# Patient Record
Sex: Female | Born: 2000 | Race: Black or African American | Hispanic: No | Marital: Single | Smoking: Never smoker
Health system: Southern US, Community
[De-identification: ages and names within clinical notes are randomized; demographics above are authoritative.]

## PROBLEM LIST (undated history)

## (undated) ENCOUNTER — Emergency Department (HOSPITAL_BASED_OUTPATIENT_CLINIC_OR_DEPARTMENT_OTHER): Discharge status: Absent without leave | Payer: Medicaid Other

## (undated) DIAGNOSIS — R0981 Nasal congestion: Secondary | ICD-10-CM

## (undated) HISTORY — DX: Nasal congestion: R09.81

## (undated) HISTORY — PX: ADENOIDECTOMY: SUR15

## (undated) HISTORY — PX: NASAL SEPTUM SURGERY: SHX37

---
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
## 2019-01-26 ENCOUNTER — Telehealth (HOSPITAL_BASED_OUTPATIENT_CLINIC_OR_DEPARTMENT_OTHER): Payer: Self-pay | Admitting: Emergency Medicine

## 2019-03-30 ENCOUNTER — Emergency Department (HOSPITAL_BASED_OUTPATIENT_CLINIC_OR_DEPARTMENT_OTHER): Payer: Medicaid Other

## 2019-03-30 ENCOUNTER — Other Ambulatory Visit: Payer: Self-pay

## 2019-03-30 ENCOUNTER — Encounter (HOSPITAL_BASED_OUTPATIENT_CLINIC_OR_DEPARTMENT_OTHER): Payer: Self-pay | Admitting: Emergency Medicine

## 2019-03-30 ENCOUNTER — Emergency Department (HOSPITAL_BASED_OUTPATIENT_CLINIC_OR_DEPARTMENT_OTHER)
Admission: EM | Admit: 2019-03-30 | Discharge: 2019-03-30 | Disposition: A | Discharge status: Home / Self Care | Payer: Medicaid Other | Attending: Emergency Medicine | Admitting: Emergency Medicine

## 2019-03-30 DIAGNOSIS — R079 Chest pain, unspecified: Secondary | ICD-10-CM

## 2019-03-30 DIAGNOSIS — Z791 Long term (current) use of non-steroidal anti-inflammatories (NSAID): Secondary | ICD-10-CM | POA: Diagnosis not present

## 2019-03-30 DIAGNOSIS — R0789 Other chest pain: Secondary | ICD-10-CM | POA: Insufficient documentation

## 2019-03-30 DIAGNOSIS — Z79899 Other long term (current) drug therapy: Secondary | ICD-10-CM | POA: Insufficient documentation

## 2019-03-30 DIAGNOSIS — K219 Gastro-esophageal reflux disease without esophagitis: Secondary | ICD-10-CM | POA: Diagnosis not present

## 2019-03-30 LAB — CBC WITH DIFFERENTIAL/PLATELET
Abs Immature Granulocytes: 0.02 10*3/uL (ref 0.00–0.07)
Basophils Absolute: 0 10*3/uL (ref 0.0–0.1)
Basophils Relative: 0 %
Eosinophils Absolute: 0.1 10*3/uL (ref 0.0–0.5)
Eosinophils Relative: 1 %
HCT: 41.2 % (ref 36.0–46.0)
Hemoglobin: 13.6 g/dL (ref 12.0–15.0)
Immature Granulocytes: 0 %
Lymphocytes Relative: 19 %
Lymphs Abs: 1.6 10*3/uL (ref 0.7–4.0)
MCH: 31.8 pg (ref 26.0–34.0)
MCHC: 33 g/dL (ref 30.0–36.0)
MCV: 96.3 fL (ref 80.0–100.0)
Monocytes Absolute: 0.5 10*3/uL (ref 0.1–1.0)
Monocytes Relative: 6 %
Neutro Abs: 6 10*3/uL (ref 1.7–7.7)
Neutrophils Relative %: 74 %
Platelets: 235 10*3/uL (ref 150–400)
RBC: 4.28 MIL/uL (ref 3.87–5.11)
RDW: 14.4 % (ref 11.5–15.5)
WBC: 8.2 10*3/uL (ref 4.0–10.5)
nRBC: 0 % (ref 0.0–0.2)

## 2019-03-30 LAB — BASIC METABOLIC PANEL
Anion gap: 12 (ref 5–15)
BUN: 17 mg/dL (ref 6–20)
CO2: 22 mmol/L (ref 22–32)
Calcium: 9.4 mg/dL (ref 8.9–10.3)
Chloride: 103 mmol/L (ref 98–111)
Creatinine, Ser: 0.82 mg/dL (ref 0.44–1.00)
GFR calc Af Amer: >60 mL/min (ref >60)
GFR calc non Af Amer: >60 mL/min (ref >60)
Glucose, Bld: 68 mg/dL — ABNORMAL LOW (ref 70–99)
Potassium: 3.8 mmol/L (ref 3.5–5.1)
Sodium: 137 mmol/L (ref 135–145)

## 2019-03-30 LAB — TROPONIN I (HIGH SENSITIVITY): Troponin I (High Sensitivity): <2 ng/L (ref <18)

## 2019-03-30 LAB — D-DIMER, QUANTITATIVE: D-Dimer, Quant: 0.3 ug/mL-FEU (ref 0.00–0.50)

## 2019-03-30 MED ORDER — ALUM & MAG HYDROXIDE-SIMETH 200-200-20 MG/5ML PO SUSP
30.0000 mL | Freq: Once | ORAL | Status: AC
  Administered 2019-03-30: 30 mL via ORAL
  Filled 2019-03-30: qty 30

## 2019-03-30 MED ORDER — LIDOCAINE VISCOUS HCL 2 % MT SOLN
15.0000 mL | Freq: Once | OROMUCOSAL | Status: AC
  Administered 2019-03-30: 15 mL via ORAL
  Filled 2019-03-30: qty 15

## 2019-03-30 NOTE — ED Triage Notes (Signed)
Pt c/o acute onset generalized chest pain that pt reports started at 0100 today. Pt denies cough, fever or radiation of pain. Pt denies N/V

## 2019-03-30 NOTE — Discharge Instructions (Signed)
Your labwork, chest xray, and EKG were very reassuring today. Your symptoms sound consistent with acid reflux given they started 30 minutes after you ate and immediately laid down flat.   After you eat it is important to sit up for 2-3 hours to prevent acid reflux. Avoid spicy or acidic foods as well. Attached are additional resources on the topic.   If this occurs again you can take OTC TUMS to help with your symptoms.   Please follow up with your PCP regarding your ED Visit today.

## 2019-03-30 NOTE — ED Provider Notes (Signed)
[facility] EMERGENCY DEPARTMENT Provider Note   CSN: [id number] Arrival date & time: 03/30/19  1011     History Chief Complaint  Patient presents with  . Chest Pain    Amy Kemp is a 19 y.o. female who presents to the ED today complaining of sudden onset, constant, sharp, diffuse chest pain that began at 1 AM this morning.  Patient reports that she ate a [employer] salad last night with fried chicken on it around midnight.  She states that she went to sleep approximately 30 minutes later and then woke up out of her sleep approximately 30 minutes after that (1 hour after eating) with chest pain.  Patient states that she has shortness of breath with it.  She has not noticed any exacerbating or alleviating factors.  She states that she has not taken anything for the pain.  Has never had pain like this before.  No family history of MIs.  No recent prolonged travel or immobilization.  No hemoptysis.  No exogenous hormone use.  No active malignancy.  No family history of clotting disorders.  Patient denies fever, chills, nausea, vomiting, abdominal pain, any other associated symptoms.   The history is provided by the patient.       Past Medical History:  Diagnosis Date  . Nasal congestion     Patient Active Problem List   Diagnosis Date Noted  . Perennial allergic rhinitis 03/26/2017  . Allergic conjunctivitis 03/26/2017  . 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 Fracture of radius 12/31/2010    Past Surgical History:  Procedure Laterality Date  . ADENOIDECTOMY    . ADENOIDECTOMY       OB History   No obstetric history on file.     Family History  Problem Relation Age of Onset  . Allergic rhinitis Sister   . Allergic rhinitis Brother   . Angioedema Neg Hx   . Asthma Neg Hx   . Eczema Neg Hx   . Immunodeficiency Neg Hx   . Urticaria Neg Hx     Social History   Tobacco Use  . Smoking status: Never Smoker  . Smokeless tobacco: Never Used  Substance Use Topics    . Alcohol use: No  . Drug use: No    Home Medications Prior to Admission medications   Medication Sig Start Date End Date Taking? Authorizing Provider  azelastine (ASTELIN) 0.1 % nasal spray Place 1-2 sprays into both nostrils 2 (two) times daily as needed for rhinitis. 03/26/17   Bobbitt, Heywood Iles, MD  butalbital-acetaminophen-caffeine (FIORICET, ESGIC) [phone] MG tablet Take 1 tablet by mouth every 6 (six) hours as needed for headache. 05/31/18 05/31/19  Lawyer, Cristal Deer, PA-C  Carbinoxamine Maleate 4 MG TABS Take 1 tablet (4 mg total) by mouth every 8 (eight) hours as needed. 03/26/17   Bobbitt, Heywood Iles, MD  cetirizine (ZYRTEC) 10 MG tablet TK 1 T PO QD 03/03/17   [provider]  ibuprofen (ADVIL,MOTRIN) 800 MG tablet Take 1 tablet (800 mg total) by mouth every 8 (eight) hours as needed. 05/30/18   Lawyer, Cristal Deer, PA-C  loratadine (CLARITIN) 10 MG tablet Take 10 mg by mouth daily as needed for allergies or rhinitis.    [provider]  montelukast (SINGULAIR) 10 MG tablet Take 1 tablet (10 mg total) by mouth at bedtime. 03/26/17   Bobbitt, Heywood Iles, MD  naproxen (NAPROSYN) 375 MG tablet Take 1 tablet (375 mg total) by mouth 2 (two) times daily. 06/21/18   Arthor Captain, PA-C  Olopatadine HCl (PAZEO) 0.7 % SOLN Place 1 drop into both eyes daily as needed. 03/26/17   Bobbitt, Heywood Iles, MD  promethazine (PHENERGAN) 25 MG tablet Take 1 tablet (25 mg total) by mouth every 6 (six) hours as needed for nausea or vomiting. 03/12/18   Lawyer, Cristal Deer, PA-C  traMADol (ULTRAM) 50 MG tablet Take 1 tablet (50 mg total) by mouth every 6 (six) hours as needed for severe pain. 05/30/18   Lawyer, Cristal Deer, PA-C    Allergies    Patient has no known allergies.  Review of Systems   Review of Systems  Constitutional: Negative for chills and fever.  Respiratory: Positive for shortness of breath. Negative for cough.   Cardiovascular: Positive for chest pain. Negative for  palpitations and leg swelling.  Gastrointestinal: Negative for abdominal pain, nausea and vomiting.  All other systems reviewed and are negative.   Physical Exam Updated Vital Signs BP 116/69 (BP Location: Left Arm)   Pulse (!) 55   Temp 98.8 F (37.1 C) (Oral)   Resp 18   Ht 5\' 3"  (1.6 m)   Wt 63 kg   LMP 03/23/2019 (Approximate)   SpO2 97%   BMI 24.62 kg/m   Physical Exam Vitals and nursing note reviewed.  Constitutional:      Appearance: She is not ill-appearing or diaphoretic.  HENT:     Head: Normocephalic and atraumatic.  Eyes:     Conjunctiva/sclera: Conjunctivae normal.  Cardiovascular:     Rate and Rhythm: Normal rate and regular rhythm.     Pulses:          Radial pulses are 2+ on the right side and 2+ on the left side.       Dorsalis pedis pulses are 2+ on the right side and 2+ on the left side.  Pulmonary:     Effort: Pulmonary effort is normal.     Breath sounds: Normal breath sounds. No decreased breath sounds, wheezing, rhonchi or rales.  Chest:     Chest wall: No tenderness.  Abdominal:     Palpations: Abdomen is soft.     Tenderness: There is no abdominal tenderness.  Musculoskeletal:     Cervical back: Neck supple.     Right lower leg: No tenderness. No edema.     Left lower leg: No tenderness. No edema.  Skin:    General: Skin is warm and dry.  Neurological:     Mental Status: She is alert.     ED Results / Procedures / Treatments   Labs (all labs ordered are listed, but only abnormal results are displayed) Labs Reviewed  BASIC METABOLIC PANEL - Abnormal; Notable for the following components:      Result Value   Glucose, Bld 68 (*)    All other components within normal limits  CBC WITH DIFFERENTIAL/PLATELET  D-DIMER, QUANTITATIVE (NOT AT [facility])  TROPONIN I (HIGH SENSITIVITY)    EKG EKG Interpretation  Date/Time:  Wednesday March 30 2019 10:54:34 EST Ventricular Rate:  55 PR Interval:  136 QRS Duration: 80 QT  Interval:  460 QTC Calculation: 440 R Axis:   82 Text Interpretation: Sinus bradycardia ST elevation, consider early repolarization, pericarditis, or injury Abnormal ECG No significant change since last tracing Confirmed by 04-01-1969 [phone]) on 03/30/2019 12:53:13 PM   Radiology DG Chest 2 View  Result Date: 03/30/2019 CLINICAL DATA:  Chest pain EXAM: CHEST - 2 VIEW COMPARISON:  December 22, 2017 FINDINGS: Lungs are clear. Heart size and pulmonary  vascularity are normal. No adenopathy. No pneumothorax. No bone lesions. IMPRESSION: No abnormality noted. Electronically Signed   By: Lowella Grip III M.D.   On: 03/30/2019 13:04    Procedures Procedures (including critical care time)  Medications Ordered in ED Medications  alum & mag hydroxide-simeth (MAALOX/MYLANTA) 200-200-20 MG/5ML suspension 30 mL (30 mLs Oral Given 03/30/19 1322)    And  lidocaine (XYLOCAINE) 2 % viscous mouth solution 15 mL (15 mLs Oral Given 03/30/19 1322)    ED Course  I have reviewed the triage vital signs and the nursing notes.  Pertinent labs & imaging results that were available during my care of the patient were reviewed by me and considered in my medical decision making (see chart for details).  19 year old female who presents the ED today complaining of diffuse chest pain that began 1 hour after eating a [employer] salad with fried chicken.  She states she went to sleep immediately after eating this.  Reports a burning sensation in her chest.  She does state she feels short of breath as well.  Daughters are stable on arrival to the ED.  Patient is afebrile, nontachycardic, nontachypneic.  She is satting appropriately on room air.  She has no risk factors for PE.  No significant family history for ACS.  She is young and otherwise healthy I doubt ACS today.  Do not feel she needs a troponin at this time.  Will obtain chest x-ray given complaint of shortness of breath to evaluate for any pulmonary manifestations causing  her chest pain.  Will give GI cocktail and obtain screening labs at this time.  EKG unchanged from previous; no signs of ischemia. Discussed case with attending physician Dr. Darl Householder who agrees with plan.   CXR unremarkable. No abnormalities on CBC and BMP. Upon reevaluation after GI cocktail pt states her pain is still an 8/10. She is sitting upright in bed and appears to be in no acute distress. I have very little concern for PE but given continuation of pain I will order a D dimer and troponin at this time.   D dimer negative. Troponin < 2. After reevaluation pt states she is ready to go home as her symptoms have resolved. Given this is a one time recurrence and pt is young do not feel she needs to be started on a PPI at this time. I had lengthy discussion with pt in regards to not laying down for 2-3 hours after eating, avoiding spicy/greasy foods, and to take OTC calcium carbonate as needed if symptoms reoccur. Pt is advised to follow up with her PCP. Strict return precautions discussed. Pt in agreement with plan and stable for discharge home.   Clinical Course as of Mar 29 1717  Wed Mar 30, 2019  1512 D-Dimer, Quant: 0.30 [MV]  1654 Troponin I (High Sensitivity): <2 [MV]    Clinical Course User Index [MV] Eustaquio Maize, PA-C   This note was prepared using Dragon voice recognition software and may include unintentional dictation errors due to the inherent limitations of voice recognition software.  MDM Rules/Calculators/A&P                       Final Clinical Impression(s) / ED Diagnoses Final diagnoses:  Nonspecific chest pain  Gastroesophageal reflux disease without esophagitis    Rx / DC Orders ED Discharge Orders    None       Discharge Instructions     Your labwork, chest xray, and EKG were  very reassuring today. Your symptoms sound consistent with acid reflux given they started 30 minutes after you ate and immediately laid down flat.   After you eat it is important to  sit up for 2-3 hours to prevent acid reflux. Avoid spicy or acidic foods as well. Attached are additional resources on the topic.   If this occurs again you can take OTC TUMS to help with your symptoms.   Please follow up with your PCP regarding your ED Visit today.        Tanda Rockers, PA-C 03/30/19 1719    Charlynne Pander, MD 03/31/19 [phone]

## 2019-04-20 ENCOUNTER — Encounter (HOSPITAL_BASED_OUTPATIENT_CLINIC_OR_DEPARTMENT_OTHER): Payer: Self-pay | Admitting: Emergency Medicine

## 2019-04-20 ENCOUNTER — Other Ambulatory Visit: Payer: Self-pay

## 2019-04-20 ENCOUNTER — Emergency Department (HOSPITAL_BASED_OUTPATIENT_CLINIC_OR_DEPARTMENT_OTHER)
Admission: EM | Admit: 2019-04-20 | Discharge: 2019-04-20 | Disposition: A | Discharge status: Home / Self Care | Payer: Medicaid Other | Attending: Emergency Medicine | Admitting: Emergency Medicine

## 2019-04-20 ENCOUNTER — Emergency Department (HOSPITAL_BASED_OUTPATIENT_CLINIC_OR_DEPARTMENT_OTHER): Payer: Medicaid Other

## 2019-04-20 DIAGNOSIS — Y998 Other external cause status: Secondary | ICD-10-CM | POA: Diagnosis not present

## 2019-04-20 DIAGNOSIS — S93402A Sprain of unspecified ligament of left ankle, initial encounter: Secondary | ICD-10-CM | POA: Insufficient documentation

## 2019-04-20 DIAGNOSIS — M79672 Pain in left foot: Secondary | ICD-10-CM | POA: Diagnosis not present

## 2019-04-20 DIAGNOSIS — Y92310 Basketball court as the place of occurrence of the external cause: Secondary | ICD-10-CM | POA: Diagnosis not present

## 2019-04-20 DIAGNOSIS — S99912A Unspecified injury of left ankle, initial encounter: Secondary | ICD-10-CM | POA: Diagnosis present

## 2019-04-20 DIAGNOSIS — X509XXA Other and unspecified overexertion or strenuous movements or postures, initial encounter: Secondary | ICD-10-CM | POA: Diagnosis not present

## 2019-04-20 DIAGNOSIS — Y9367 Activity, basketball: Secondary | ICD-10-CM | POA: Diagnosis not present

## 2019-04-20 NOTE — ED Provider Notes (Signed)
[facility] EMERGENCY DEPARTMENT Provider Note   CSN: [id number] Arrival date & time: 04/20/19  3664     History Chief Complaint  Patient presents with  . Ankle Pain  . Foot Pain    Amy Kemp is a 19 y.o. female.  Patient presents with acute onset of left foot and ankle pain after she twisted it while playing basketball yesterday.  She did not fall and denies other injuries.  She has been able to walk on it somewhat.  She has applied a wrap and ice at home.  No oral medications.  She denies knee pain or hip pain.  She has some numbness in her toes.  Onset of symptoms acute.  Pain is worse with palpation and movement.        Past Medical History:  Diagnosis Date  . Nasal congestion     Patient Active Problem List   Diagnosis Date Noted  . Perennial allergic rhinitis 03/26/2017  . Allergic conjunctivitis 03/26/2017  . 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 Fracture of radius 12/31/2010    Past Surgical History:  Procedure Laterality Date  . ADENOIDECTOMY    . ADENOIDECTOMY       OB History   No obstetric history on file.     Family History  Problem Relation Age of Onset  . Allergic rhinitis Sister   . Allergic rhinitis Brother   . Angioedema Neg Hx   . Asthma Neg Hx   . Eczema Neg Hx   . Immunodeficiency Neg Hx   . Urticaria Neg Hx     Social History   Tobacco Use  . Smoking status: Never Smoker  . Smokeless tobacco: Never Used  Substance Use Topics  . Alcohol use: No  . Drug use: No    Home Medications Prior to Admission medications   Not on File    Allergies    Patient has no known allergies.  Review of Systems   Review of Systems  Constitutional: Negative for activity change.  Musculoskeletal: Positive for arthralgias and joint swelling. Negative for back pain and neck pain.  Skin: Negative for wound.  Neurological: Negative for weakness and numbness.    Physical Exam Updated Vital Signs BP 116/74   Pulse 71   Temp 98.9  F (37.2 C)   LMP 03/27/2019   SpO2 98%   Physical Exam Vitals and nursing note reviewed.  Constitutional:      Appearance: She is well-developed.  HENT:     Head: Normocephalic and atraumatic.  Eyes:     Conjunctiva/sclera: Conjunctivae normal.  Cardiovascular:     Pulses:          Dorsalis pedis pulses are 2+ on the right side and 2+ on the left side.       Posterior tibial pulses are 2+ on the right side and 2+ on the left side.  Musculoskeletal:        General: Tenderness present.     Cervical back: Normal range of motion and neck supple.     Left knee: No swelling. No tenderness.     Left ankle: No swelling or deformity. Tenderness present over the lateral malleolus and medial malleolus. No base of 5th metatarsal or proximal fibula tenderness.     Left Achilles Tendon: Normal.     Left foot: Tenderness (forefoot) present. No swelling.  Skin:    General: Skin is warm and dry.  Neurological:     Mental Status: She is alert.  Comments: Distal motor, sensation, and vascular intact.      ED Results / Procedures / Treatments   Labs (all labs ordered are listed, but only abnormal results are displayed) Labs Reviewed - No data to display  EKG None  Radiology No results found.  Procedures Procedures (including critical care time)  Medications Ordered in ED Medications - No data to display  ED Course  I have reviewed the triage vital signs and the nursing notes.  Pertinent labs & imaging results that were available during my care of the patient were reviewed by me and considered in my medical decision making (see chart for details).  Patient seen and examined.  X-rays ordered.  Patient declines oral pain meds.  Vital signs reviewed and are as follows: BP 116/74   Pulse 71   Temp 98.9 F (37.2 C)   LMP 03/27/2019   SpO2 98%    10:33 AM x-rays are negative.  Encouraged continued NSAIDs, rice protocol at home.  Sports medicine follow-up if not improved in 1  week.     MDM Rules/Calculators/A&P                      Patient with foot and ankle injury after playing basketball and twisting it.  No fractures.  Suspect sprain.  Continue conservative measures.  Lower extremity is neurovascularly intact.   Final Clinical Impression(s) / ED Diagnoses Final diagnoses:  Sprain of left ankle, unspecified ligament, initial encounter  Left foot pain    Rx / DC Orders ED Discharge Orders    None       Renne Crigler, Cordelia Poche 04/20/19 1033    Terrilee Files, MD 04/20/19 1818

## 2019-04-20 NOTE — Discharge Instructions (Signed)
Please read and follow all provided instructions.  Your diagnoses today include:  1. Sprain of left ankle, unspecified ligament, initial encounter   2. Left foot pain     Tests performed today include:  An x-ray of your ankle and foot - do NOT show any broken bones  Vital signs. See below for your results today.   Medications prescribed:  Please use over-the-counter NSAID medications (ibuprofen, naproxen) as directed on the packaging for pain.   Take any prescribed medications only as directed.  Home care instructions:   Follow any educational materials contained in this packet  Follow R.I.C.E. Protocol:  R - rest your injury   I  - use ice on injury without applying directly to skin  C - compress injury with bandage or splint  E - elevate the injury as much as possible  Follow-up instructions: Please follow-up with your primary care provider or the provided orthopedic (bone specialist) if you continue to have significant pain or trouble walking in 1 week. In this case you may have a severe sprain that requires further care.   Return instructions:   Please return if your toes are numb or tingling, appear gray or blue, or you have severe pain (also elevate leg and loosen splint or wrap)  Please return to the Emergency Department if you experience worsening symptoms.   Please return if you have any other emergent concerns.  Additional Information:  Your vital signs today were: BP 116/74   Pulse 71   Temp 98.9 F (37.2 C)   LMP 03/27/2019   SpO2 98%  If your blood pressure (BP) was elevated above 135/85 this visit, please have this repeated by your doctor within one month. --------------

## 2019-04-20 NOTE — ED Triage Notes (Signed)
Pt here after twisting left ankle while playing basketball yesterday. Presents with it wrapped and states it has been iced.

## 2019-10-17 ENCOUNTER — Encounter (HOSPITAL_BASED_OUTPATIENT_CLINIC_OR_DEPARTMENT_OTHER): Payer: Self-pay | Admitting: Emergency Medicine

## 2019-10-17 ENCOUNTER — Emergency Department (HOSPITAL_BASED_OUTPATIENT_CLINIC_OR_DEPARTMENT_OTHER)
Admission: EM | Admit: 2019-10-17 | Discharge: 2019-10-17 | Disposition: A | Discharge status: Home / Self Care | Payer: Medicaid Other | Attending: Emergency Medicine | Admitting: Emergency Medicine

## 2019-10-17 ENCOUNTER — Other Ambulatory Visit: Payer: Self-pay

## 2019-10-17 DIAGNOSIS — Z5321 Procedure and treatment not carried out due to patient leaving prior to being seen by health care provider: Secondary | ICD-10-CM | POA: Diagnosis not present

## 2019-10-17 DIAGNOSIS — R109 Unspecified abdominal pain: Secondary | ICD-10-CM | POA: Diagnosis not present

## 2019-10-17 DIAGNOSIS — R1111 Vomiting without nausea: Secondary | ICD-10-CM | POA: Insufficient documentation

## 2019-10-17 LAB — URINALYSIS, ROUTINE W REFLEX MICROSCOPIC
Bilirubin Urine: NEGATIVE
Glucose, UA: NEGATIVE mg/dL
Ketones, ur: NEGATIVE mg/dL
Nitrite: NEGATIVE
Protein, ur: NEGATIVE mg/dL
Specific Gravity, Urine: 1.015 (ref 1.005–1.030)
pH: 6 (ref 5.0–8.0)

## 2019-10-17 LAB — URINALYSIS, MICROSCOPIC (REFLEX)

## 2019-10-17 LAB — PREGNANCY, URINE: Preg Test, Ur: NEGATIVE

## 2019-10-17 NOTE — ED Triage Notes (Signed)
Abdominal pain and vomiting since waking up this morning.  Denies diarrhea.

## 2019-10-31 ENCOUNTER — Encounter (HOSPITAL_BASED_OUTPATIENT_CLINIC_OR_DEPARTMENT_OTHER): Payer: Self-pay | Admitting: Emergency Medicine

## 2019-10-31 ENCOUNTER — Other Ambulatory Visit: Payer: Self-pay

## 2019-10-31 ENCOUNTER — Emergency Department (HOSPITAL_BASED_OUTPATIENT_CLINIC_OR_DEPARTMENT_OTHER): Payer: No Typology Code available for payment source

## 2019-10-31 ENCOUNTER — Emergency Department (HOSPITAL_BASED_OUTPATIENT_CLINIC_OR_DEPARTMENT_OTHER)
Admission: EM | Admit: 2019-10-31 | Discharge: 2019-11-01 | Disposition: A | Discharge status: Home / Self Care | Payer: No Typology Code available for payment source | Attending: Emergency Medicine | Admitting: Emergency Medicine

## 2019-10-31 DIAGNOSIS — Y929 Unspecified place or not applicable: Secondary | ICD-10-CM | POA: Diagnosis not present

## 2019-10-31 DIAGNOSIS — Z23 Encounter for immunization: Secondary | ICD-10-CM | POA: Insufficient documentation

## 2019-10-31 DIAGNOSIS — S8001XA Contusion of right knee, initial encounter: Secondary | ICD-10-CM | POA: Diagnosis not present

## 2019-10-31 DIAGNOSIS — S80911A Unspecified superficial injury of right knee, initial encounter: Secondary | ICD-10-CM | POA: Diagnosis present

## 2019-10-31 DIAGNOSIS — Y999 Unspecified external cause status: Secondary | ICD-10-CM | POA: Insufficient documentation

## 2019-10-31 DIAGNOSIS — Y939 Activity, unspecified: Secondary | ICD-10-CM | POA: Insufficient documentation

## 2019-10-31 DIAGNOSIS — S80211A Abrasion, right knee, initial encounter: Secondary | ICD-10-CM | POA: Diagnosis not present

## 2019-10-31 DIAGNOSIS — T148XXA Other injury of unspecified body region, initial encounter: Secondary | ICD-10-CM

## 2019-10-31 MED ORDER — TETANUS-DIPHTH-ACELL PERTUSSIS 5-2.5-18.5 LF-MCG/0.5 IM SUSP
0.5000 mL | Freq: Once | INTRAMUSCULAR | Status: AC
  Administered 2019-11-01: 0.5 mL via INTRAMUSCULAR
  Filled 2019-10-31: qty 0.5

## 2019-10-31 MED ORDER — OXYCODONE-ACETAMINOPHEN 5-325 MG PO TABS
1.0000 | ORAL_TABLET | Freq: Once | ORAL | Status: AC
  Administered 2019-11-01: 1 via ORAL
  Filled 2019-10-31: qty 1
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
## 2019-11-01 DIAGNOSIS — S8001XA Contusion of right knee, initial encounter: Secondary | ICD-10-CM | POA: Diagnosis not present

## 2019-11-01 MED ORDER — NAPROXEN 500 MG PO TABS: 500.0000 mg | ORAL_TABLET | Freq: Two times a day (BID) | ORAL | 0 refills | Status: AC
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
## 2020-05-28 ENCOUNTER — Other Ambulatory Visit: Payer: Self-pay

## 2020-05-28 ENCOUNTER — Emergency Department (HOSPITAL_BASED_OUTPATIENT_CLINIC_OR_DEPARTMENT_OTHER): Payer: Medicaid Other

## 2020-05-28 ENCOUNTER — Encounter (HOSPITAL_BASED_OUTPATIENT_CLINIC_OR_DEPARTMENT_OTHER): Payer: Self-pay | Admitting: *Deleted

## 2020-05-28 ENCOUNTER — Emergency Department (HOSPITAL_BASED_OUTPATIENT_CLINIC_OR_DEPARTMENT_OTHER)
Admission: EM | Admit: 2020-05-28 | Discharge: 2020-05-28 | Disposition: A | Discharge status: Home / Self Care | Payer: Medicaid Other | Attending: Emergency Medicine | Admitting: Emergency Medicine

## 2020-05-28 DIAGNOSIS — Y92410 Unspecified street and highway as the place of occurrence of the external cause: Secondary | ICD-10-CM | POA: Insufficient documentation

## 2020-05-28 DIAGNOSIS — S59912A Unspecified injury of left forearm, initial encounter: Secondary | ICD-10-CM | POA: Diagnosis present

## 2020-05-28 DIAGNOSIS — M542 Cervicalgia: Secondary | ICD-10-CM | POA: Insufficient documentation

## 2020-05-28 DIAGNOSIS — M79602 Pain in left arm: Secondary | ICD-10-CM

## 2020-05-28 DIAGNOSIS — M79622 Pain in left upper arm: Secondary | ICD-10-CM | POA: Insufficient documentation

## 2020-05-28 DIAGNOSIS — M25561 Pain in right knee: Secondary | ICD-10-CM | POA: Diagnosis not present

## 2020-05-28 DIAGNOSIS — S5012XA Contusion of left forearm, initial encounter: Secondary | ICD-10-CM | POA: Diagnosis not present

## 2020-05-28 MED ORDER — METHOCARBAMOL 500 MG PO TABS: 500.0000 mg | ORAL_TABLET | Freq: Two times a day (BID) | ORAL | 0 refills | Status: AC

## 2020-05-28 MED ORDER — HYDROCODONE-ACETAMINOPHEN 5-325 MG PO TABS
1.0000 | ORAL_TABLET | Freq: Once | ORAL | Status: AC
  Administered 2020-05-28: 1 via ORAL
  Filled 2020-05-28: qty 1
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
## 2020-05-28 NOTE — Discharge Instructions (Signed)

## 2020-11-21 ENCOUNTER — Encounter (HOSPITAL_BASED_OUTPATIENT_CLINIC_OR_DEPARTMENT_OTHER): Payer: Self-pay

## 2020-11-21 ENCOUNTER — Emergency Department (HOSPITAL_BASED_OUTPATIENT_CLINIC_OR_DEPARTMENT_OTHER): Payer: Medicaid Other

## 2020-11-21 ENCOUNTER — Other Ambulatory Visit: Payer: Self-pay

## 2020-11-21 ENCOUNTER — Emergency Department (HOSPITAL_BASED_OUTPATIENT_CLINIC_OR_DEPARTMENT_OTHER)
Admission: EM | Admit: 2020-11-21 | Discharge: 2020-11-21 | Disposition: A | Discharge status: Home / Self Care | Payer: Medicaid Other | Attending: Emergency Medicine | Admitting: Emergency Medicine

## 2020-11-21 DIAGNOSIS — M79642 Pain in left hand: Secondary | ICD-10-CM | POA: Diagnosis not present

## 2020-11-21 DIAGNOSIS — S6992XA Unspecified injury of left wrist, hand and finger(s), initial encounter: Secondary | ICD-10-CM | POA: Insufficient documentation

## 2020-11-21 DIAGNOSIS — W228XXA Striking against or struck by other objects, initial encounter: Secondary | ICD-10-CM | POA: Diagnosis not present

## 2020-11-21 MED ORDER — IBUPROFEN 800 MG PO TABS
800.0000 mg | ORAL_TABLET | Freq: Once | ORAL | Status: AC
  Administered 2020-11-21: 800 mg via ORAL
  Filled 2020-11-21: qty 1

## 2020-11-21 NOTE — Discharge Instructions (Addendum)
Take ibuprofen 3 times a day with meals.  Do not take other anti-inflammatories at the same time (Advil, Motrin, naproxen, Aleve). You may supplement with Tylenol if you need further pain control. Use ice packs as needed for pain and swelling.  Use the splint and buddy taping for support/pain control.  Follow-up with your primary care doctor and/or the hand doctor listed below as needed for further evaluation. Return to the emergency room if develop severe worsening pain, color change of the into your fingers, inability to feel your fingers, or any new, worsening, or concerning symptoms
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
## 2020-11-21 NOTE — ED Provider Notes (Signed)
Of [facility] EMERGENCY DEPARTMENT Provider Note   CSN: [id number] Arrival date & time: 11/21/20  1221     History Chief Complaint  Patient presents with   Hand Injury    Amy Kemp is a 20 y.o. female presenting for evaluation of left hand injury.  Patient states 2 weeks ago she had her left hand on the dresser and sharply abducted her ring and pinkly fingers.  Over the past 2 weeks, she states pain is constant and worsening.  She has not taken anything for it including Tylenol ibuprofen.  It is waking her up at night.  She denies repeat injury.  She denies numbness or tingling.  No injury elsewhere.  She has no medical problems, takes medications daily.  Pain is worse with movement and palpation, nothing makes better.  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     Family History  Problem Relation Age of Onset   Allergic rhinitis Sister    Allergic rhinitis Brother    Angioedema Neg Hx    Asthma Neg Hx    Eczema Neg Hx    Immunodeficiency Neg Hx    Urticaria Neg Hx     Social History   Tobacco Use   Smoking status: Never   Smokeless tobacco: Never  Vaping Use   Vaping Use: Never used  Substance Use Topics   Alcohol use: No   Drug use: No    Home Medications Prior to Admission medications   Medication Sig Start Date End Date Taking? Authorizing Provider  cetirizine (ZYRTEC) 10 MG tablet Take 10 mg by mouth daily as needed. 07/13/19   [provider]  methocarbamol (ROBAXIN) 500 MG tablet Take 1 tablet (500 mg total) by mouth 2 (two) times daily. 05/28/20   Maxwell Caul,  PA-C  naproxen (NAPROSYN) 500 MG tablet Take 1 tablet (500 mg total) by mouth 2 (two) times daily. 11/01/19   Horton, Mayer Masker, MD    Allergies    Patient has no known allergies.  Review of Systems   Review of Systems  Musculoskeletal:  Positive for arthralgias and myalgias.  Neurological:  Negative for weakness.   Physical Exam Updated Vital Signs BP 118/81 (BP Location: Right Arm)   Pulse (!) 56   Temp 98.6 F (37 C) (Oral)   Resp 18   Ht 5\' 2"  (1.575 m)   Wt 68.5 kg   SpO2 100%   BMI 27.62 kg/m   Physical Exam Vitals and nursing note reviewed.  Constitutional:      General: She is not in acute distress.    Appearance: She is well-developed.  HENT:     Head: Normocephalic and atraumatic.  Eyes:     Extraocular Movements: Extraocular movements intact.  Cardiovascular:     Rate and Rhythm: Normal rate.  Pulmonary:     Effort: Pulmonary effort is normal.  Abdominal:     General: There is no distension.  Musculoskeletal:        General: Tenderness present. Normal range of motion.  Cervical back: Normal range of motion.     Comments: Tenderness palpation of the fourth and fifth MCP of the left hand.  No erythema, warmth, swelling, or deformity.  Good distal sensation and cap refill.  Patient with limited range of motion, specifically flexion of the fingers due to pain, however able to do minimal movements indicating the tendons are intact.  No tenderness palpation over the anatomic snuffbox.  Radial pulse 2+  Skin:    General: Skin is warm.     Findings: No rash.  Neurological:     Mental Status: She is alert and oriented to person, place, and time.    ED Results / Procedures / Treatments   Labs (all labs ordered are listed, but only abnormal results are displayed) Labs Reviewed - No data to display  EKG None  Radiology DG Hand Complete Left  Result Date: 11/21/2020 CLINICAL DATA:  injury EXAM: LEFT HAND - COMPLETE 3+ VIEW COMPARISON:  None. FINDINGS:  There is no evidence of fracture or dislocation. There is no evidence of arthropathy or other focal bone abnormality. Soft tissues are unremarkable. IMPRESSION: No evidence of acute fracture. Electronically Signed   By: Feliberto Harts M.D.   On: 11/21/2020 13:26    Procedures Procedures   Medications Ordered in ED Medications  ibuprofen (ADVIL) tablet 800 mg (800 mg Oral Given 11/21/20 1255)    ED Course  I have reviewed the triage vital signs and the nursing notes.  Pertinent labs & imaging results that were available during my care of the patient were reviewed by me and considered in my medical decision making (see chart for details).    MDM Rules/Calculators/A&P                           Patient presenting for evaluation of left hand pain.  On exam, patient peers nontoxic.  She is neurovascular intact.  X-ray obtained from triage viewed and interpreted by me, no fracture or dislocation.  Discussed findings with patient.  Discussed likely ligamentous injury.  Will place in finger splint and buddy tape.  Discussed symptomatic management with Tylenol, ibuprofen, ice.  Follow-up with hand as needed.  At this time, patient appears safe for discharge.  Return precautions given.  Patient states she understands and agrees to plan.   Final Clinical Impression(s) / ED Diagnoses Final diagnoses:  Injury of left hand, initial encounter    Rx / DC Orders ED Discharge Orders     None        Alveria Apley, PA-C 11/21/20 1508    Derwood Kaplan, MD 11/22/20 [phone]

## 2021-04-28 ENCOUNTER — Encounter (HOSPITAL_BASED_OUTPATIENT_CLINIC_OR_DEPARTMENT_OTHER): Payer: Self-pay | Admitting: *Deleted

## 2021-04-28 ENCOUNTER — Other Ambulatory Visit: Payer: Self-pay

## 2021-04-28 ENCOUNTER — Emergency Department (HOSPITAL_BASED_OUTPATIENT_CLINIC_OR_DEPARTMENT_OTHER)
Admission: EM | Admit: 2021-04-28 | Discharge: 2021-04-28 | Disposition: A | Discharge status: Home / Self Care | Payer: Medicaid Other | Attending: Emergency Medicine | Admitting: Emergency Medicine

## 2021-04-28 DIAGNOSIS — M542 Cervicalgia: Secondary | ICD-10-CM | POA: Diagnosis present

## 2021-04-28 DIAGNOSIS — S46812A Strain of other muscles, fascia and tendons at shoulder and upper arm level, left arm, initial encounter: Secondary | ICD-10-CM

## 2021-04-28 DIAGNOSIS — S29012A Strain of muscle and tendon of back wall of thorax, initial encounter: Secondary | ICD-10-CM | POA: Diagnosis not present

## 2021-04-28 NOTE — Discharge Instructions (Signed)
Take 4 over the counter ibuprofen tablets 3 times a day or 2 over-the-counter naproxen tablets twice a day for pain. Also take tylenol 1000mg (2 extra strength) four times a day.   You have been giving a sling for comfort.  This is for your comfort only and should not stay in the sling indefinitely.  It needs to come out at least 4 times a day and perform range of motion exercises.

## 2021-04-28 NOTE — ED Provider Notes (Signed)
[facility] EMERGENCY DEPARTMENT Provider Note   CSN: [id number] Arrival date & time: 04/28/21  1350     History  Chief Complaint  Patient presents with   Neck Pain    Amy Kemp is a 21 y.o. female.  21 yo  F with a chief complaints of left shoulder pain after play fighting with a friend.  She has pain when she turns her head to the side.  She has some pain now more towards the shoulder.  She denies being choked out she denies being struck by something other than fists.  She denies loss of consciousness.   Neck Pain     Home Medications Prior to Admission medications   Medication Sig Start Date End Date Taking? Authorizing Provider  cetirizine (ZYRTEC) 10 MG tablet Take 10 mg by mouth daily as needed. 07/13/19   [provider]  methocarbamol (ROBAXIN) 500 MG tablet Take 1 tablet (500 mg total) by mouth 2 (two) times daily. 05/28/20   Maxwell Caul, PA-C  naproxen (NAPROSYN) 500 MG tablet Take 1 tablet (500 mg total) by mouth 2 (two) times daily. 11/01/19   Horton, Mayer Masker, MD      Allergies    Patient has no known allergies.    Review of Systems   Review of Systems  Musculoskeletal:  Positive for neck pain.   Physical Exam Updated Vital Signs BP 112/60    Pulse 70    Temp 98.8 F (37.1 C) (Oral)    Resp 16    Ht 5\' 1"  (1.549 m)    Wt 65.8 kg    SpO2 100%    BMI 27.40 kg/m  Physical Exam Vitals and nursing note reviewed.  Constitutional:      General: She is not in acute distress.    Appearance: She is well-developed. She is not diaphoretic.  HENT:     Head: Normocephalic and atraumatic.  Eyes:     Pupils: Pupils are equal, round, and reactive to light.  Cardiovascular:     Rate and Rhythm: Normal rate and regular rhythm.     Heart sounds: No murmur heard.   No friction rub. No gallop.  Pulmonary:     Effort: Pulmonary effort is normal.     Breath sounds: No wheezing or rales.  Abdominal:     General: There is no distension.      Palpations: Abdomen is soft.     Tenderness: There is no abdominal tenderness.  Musculoskeletal:        General: Tenderness present.     Cervical back: Normal range of motion and neck supple.     Comments: Pain along the left trapezius muscle belly.  Pulse motor and sensation intact to the left upper extremity.  No midline C-spine tenderness able to rotate her head without midline spinal pain.  Skin:    General: Skin is warm and dry.  Neurological:     Mental Status: She is alert and oriented to person, place, and time.  Psychiatric:        Behavior: Behavior normal.    ED Results / Procedures / Treatments   Labs (all labs ordered are listed, but only abnormal results are displayed) Labs Reviewed - No data to display  EKG None  Radiology No results found.  Procedures Procedures    Medications Ordered in ED Medications - No data to display  ED Course/ Medical Decision Making/ A&P  Medical Decision Making  21 yo F with a chief complaints of left-sided neck pain after roughhousing with someone else.  She likely has a trapezius strain by history and physical.  I was able to clear her C-spine with [place] C-spine rules.  We will treat supportively.  Tylenol and NSAIDs.  Sling for comfort.  Work note.  PCP follow-up.  3:02 PM:  I have discussed the diagnosis/risks/treatment options with the patient.  Evaluation and diagnostic testing in the emergency department does not suggest an emergent condition requiring admission or immediate intervention beyond what has been performed at this time.  They will follow up with  PCP. We also discussed returning to the ED immediately if new or worsening sx occur. We discussed the sx which are most concerning (e.g., sudden worsening pain, fever, inability to tolerate by mouth) that necessitate immediate return. Medications administered to the patient during their visit and any new prescriptions provided to the patient are  listed below.  Medications given during this visit Medications - No data to display   The patient appears reasonably screen and/or stabilized for discharge and I doubt any other medical condition or other [facility] requiring further screening, evaluation, or treatment in the ED at this time prior to discharge.          Final Clinical Impression(s) / ED Diagnoses Final diagnoses:  Trapezius strain, left, initial encounter    Rx / DC Orders ED Discharge Orders     None         Melene Plan, DO 04/28/21 1502

## 2021-04-28 NOTE — ED Notes (Signed)
No acute distress noted upon this RN's departure of patient. Verified discharge paperwork with name and DOB. Vital signs stable. Patient taken to checkout window. Discharge paperwork discussed with patient. Sling in place +PSCM noted. No further questions voiced upon discharge.

## 2021-04-28 NOTE — ED Triage Notes (Signed)
Pt states she was "play fighting" on Friday and now has pain in left side of her neck and shoulder. Denies abuse

## 2021-05-02 ENCOUNTER — Emergency Department (HOSPITAL_BASED_OUTPATIENT_CLINIC_OR_DEPARTMENT_OTHER)
Admission: EM | Admit: 2021-05-02 | Discharge: 2021-05-02 | Disposition: A | Discharge status: Home / Self Care | Payer: Medicaid Other | Attending: Emergency Medicine | Admitting: Emergency Medicine

## 2021-05-02 ENCOUNTER — Emergency Department (HOSPITAL_BASED_OUTPATIENT_CLINIC_OR_DEPARTMENT_OTHER): Payer: Medicaid Other

## 2021-05-02 ENCOUNTER — Other Ambulatory Visit: Payer: Self-pay

## 2021-05-02 ENCOUNTER — Encounter (HOSPITAL_BASED_OUTPATIENT_CLINIC_OR_DEPARTMENT_OTHER): Payer: Self-pay

## 2021-05-02 DIAGNOSIS — R200 Anesthesia of skin: Secondary | ICD-10-CM | POA: Insufficient documentation

## 2021-05-02 DIAGNOSIS — Y9372 Activity, wrestling: Secondary | ICD-10-CM | POA: Insufficient documentation

## 2021-05-02 DIAGNOSIS — S169XXS Unspecified injury of muscle, fascia and tendon at neck level, sequela: Secondary | ICD-10-CM | POA: Diagnosis present

## 2021-05-02 DIAGNOSIS — R7989 Other specified abnormal findings of blood chemistry: Secondary | ICD-10-CM | POA: Diagnosis not present

## 2021-05-02 DIAGNOSIS — X509XXS Other and unspecified overexertion or strenuous movements or postures, sequela: Secondary | ICD-10-CM | POA: Diagnosis not present

## 2021-05-02 DIAGNOSIS — D72829 Elevated white blood cell count, unspecified: Secondary | ICD-10-CM | POA: Diagnosis not present

## 2021-05-02 DIAGNOSIS — S161XXS Strain of muscle, fascia and tendon at neck level, sequela: Secondary | ICD-10-CM | POA: Insufficient documentation

## 2021-05-02 LAB — CBC WITH DIFFERENTIAL/PLATELET
Abs Immature Granulocytes: 0.04 10*3/uL (ref 0.00–0.07)
Basophils Absolute: 0.1 10*3/uL (ref 0.0–0.1)
Basophils Relative: 1 %
Eosinophils Absolute: 0.2 10*3/uL (ref 0.0–0.5)
Eosinophils Relative: 2 %
HCT: 37.9 % (ref 36.0–46.0)
Hemoglobin: 12.7 g/dL (ref 12.0–15.0)
Immature Granulocytes: 0 %
Lymphocytes Relative: 17 %
Lymphs Abs: 1.9 10*3/uL (ref 0.7–4.0)
MCH: 31 pg (ref 26.0–34.0)
MCHC: 33.5 g/dL (ref 30.0–36.0)
MCV: 92.4 fL (ref 80.0–100.0)
Monocytes Absolute: 0.6 10*3/uL (ref 0.1–1.0)
Monocytes Relative: 5 %
Neutro Abs: 8.3 10*3/uL — ABNORMAL HIGH (ref 1.7–7.7)
Neutrophils Relative %: 75 %
Platelets: 235 10*3/uL (ref 150–400)
RBC: 4.1 MIL/uL (ref 3.87–5.11)
RDW: 14.7 % (ref 11.5–15.5)
WBC: 11.2 10*3/uL — ABNORMAL HIGH (ref 4.0–10.5)
nRBC: 0 % (ref 0.0–0.2)

## 2021-05-02 LAB — COMPREHENSIVE METABOLIC PANEL
ALT: 11 U/L (ref 0–44)
AST: 19 U/L (ref 15–41)
Albumin: 4.5 g/dL (ref 3.5–5.0)
Alkaline Phosphatase: 59 U/L (ref 38–126)
Anion gap: 7 (ref 5–15)
BUN: 13 mg/dL (ref 6–20)
CO2: 26 mmol/L (ref 22–32)
Calcium: 9.8 mg/dL (ref 8.9–10.3)
Chloride: 105 mmol/L (ref 98–111)
Creatinine, Ser: 1.03 mg/dL — ABNORMAL HIGH (ref 0.44–1.00)
GFR, Estimated: >60 mL/min (ref >60)
Glucose, Bld: 82 mg/dL (ref 70–99)
Potassium: 3.9 mmol/L (ref 3.5–5.1)
Sodium: 138 mmol/L (ref 135–145)
Total Bilirubin: 0.3 mg/dL (ref 0.3–1.2)
Total Protein: 7.8 g/dL (ref 6.5–8.1)

## 2021-05-02 LAB — PREGNANCY, URINE: Preg Test, Ur: NEGATIVE

## 2021-05-02 MED ORDER — KETOROLAC TROMETHAMINE 60 MG/2ML IM SOLN
30.0000 mg | Freq: Once | INTRAMUSCULAR | Status: DC
  Filled 2021-05-02: qty 2

## 2021-05-02 MED ORDER — KETOROLAC TROMETHAMINE 15 MG/ML IJ SOLN
15.0000 mg | Freq: Once | INTRAMUSCULAR | Status: AC
  Administered 2021-05-02: 15 mg via INTRAVENOUS
  Filled 2021-05-02: qty 1

## 2021-05-02 MED ORDER — DIAZEPAM 5 MG PO TABS
5.0000 mg | ORAL_TABLET | Freq: Once | ORAL | Status: AC
  Administered 2021-05-02: 5 mg via ORAL
  Filled 2021-05-02: qty 1

## 2021-05-02 NOTE — ED Provider Notes (Signed)
I provided a substantive portion of the care of this patient.  I personally performed the entirety of the history, exam, and medical decision making for this encounter.      Patient with an injury that occurred on Friday, February 3.  Patient was wrestling.  Did not go down to the floor.  Patient seen Sunday here February 5 for neck pain and shoulder pain felt to be a trapezius muscle strain.  Patient seen at [facility] urgent [facility] on February 7 had x-rays done of the cervical spine and the thoracic spine that were read by radiology as negative.  Patient here today stating that now she has numbness to her entire hand and forearm.  But does have sensation up in the shoulder area.  On exam patient has a lot of trapezius muscle spasm and some rhomboid muscle spasm.  Does have movement of her fingers of her left hand.  But they are limited.  States that sensation is not intact.  Radial pulses 2+.  Patient has been treated with Naprosyn and Flexeril by the urgent care.  We will go ahead and get CT neck and CT thoracic spine.  Could consider getting x-ray of the left shoulder.    I think if that is negative patient can be symptomatically treated.  Would recommend a left arm sling.  And then follow-up with sports medicine upstairs.       Fredia Sorrow, MD 05/02/21 [phone]

## 2021-05-02 NOTE — ED Provider Notes (Signed)
[facility] EMERGENCY DEPARTMENT Provider Note   CSN: [id number] Arrival date & time: 05/02/21  0844     History Chief Complaint  Patient presents with   Neck Pain    Amy Kemp is a 21 y.o. female otherwise healthy presents emerged department for evaluation of left-sided neck pain for the past 6 days.  The patient reports she was seen in the emergency room and in an urgent care but is still not having any relief of symptoms and is now experiencing numbness and tingling down her left arm and reports she has had trouble moving it due to the pain in her neck.  Patient reports that this initially happened from "play wrestling".  She denies being choked or any loss of consciousness.  The patient's girlfriend was here on her neck when they both fell down.  She was seen by urgent care and given naproxen and a muscle relaxer.  She is taking a muscle laxer once but did not take it again.  No other medications have been trialed.  She denies any blurry vision, headaches, low back pain, chest pain, shortness of breath.  Denies any fever or recent URI symptoms.   Neck Pain Associated symptoms: numbness       Home Medications Prior to Admission medications   Medication Sig Start Date End Date Taking? Authorizing Provider  cetirizine (ZYRTEC) 10 MG tablet Take 10 mg by mouth daily as needed. 07/13/19   [provider]  methocarbamol (ROBAXIN) 500 MG tablet Take 1 tablet (500 mg total) by mouth 2 (two) times daily. 05/28/20   Volanda Napoleon, PA-C  naproxen (NAPROSYN) 500 MG tablet Take 1 tablet (500 mg total) by mouth 2 (two) times daily. 11/01/19   Horton, Barbette Hair, MD      Allergies    Patient has no known allergies.    Review of Systems   Review of Systems  Musculoskeletal:  Positive for back pain and neck pain.  Neurological:  Positive for numbness.   Physical Exam Updated Vital Signs BP 121/72    Pulse 70    Temp 98.4 F (36.9 C) (Oral)    Resp 18    Ht 5\' 1"   (1.549 m)    Wt 69.4 kg    SpO2 100%    BMI 28.91 kg/m  Physical Exam Vitals and nursing note reviewed.  Constitutional:      General: She is not in acute distress.    Appearance: Normal appearance. She is not toxic-appearing.  HENT:     Head: Normocephalic and atraumatic.  Eyes:     General: No scleral icterus. Neck:     Vascular: No carotid bruit.     Comments: Mild midline cervical tenderness palpation.  Moderate paraspinal cervical tenderness palpation.  No bony step-off or deformity noted or palpated.  Patient has some ability to rotate and flex and extend her neck although limited secondary to pain.  Carotids 2+. Cardiovascular:     Rate and Rhythm: Normal rate and regular rhythm.  Pulmonary:     Effort: Pulmonary effort is normal.     Breath sounds: Normal breath sounds.  Abdominal:     General: Bowel sounds are normal.     Palpations: Abdomen is soft.     Tenderness: There is no abdominal tenderness. There is no guarding or rebound.  Musculoskeletal:        General: Signs of injury present. No deformity.     Cervical back: Neck supple. Tenderness present. No rigidity.  Comments: Patient has a obvious muscle spasm to the left trap that is both visualized and palpated.  She has no midline thoracic or lumbar tenderness palpation.  No erythema or other overlying skin changes noted to the muscle spasm.  Is tender to palpation.  Radial pulses 2+.  Compartments are soft.  Skin:    General: Skin is warm and dry.  Neurological:     General: No focal deficit present.     Mental Status: She is alert. Mental status is at baseline.     Sensory: Sensory deficit present.     Comments: Subjective decrease in sensation to the left forearm and hand, although the patient has sensation in her upper arm.  Patient has a decreased grip strength on the left and throughout her left upper extremity 3/5.  The patient does however have equal grip strength and strength in her arms against  resistance.      ED Results / Procedures / Treatments   Labs (all labs ordered are listed, but only abnormal results are displayed) Labs Reviewed  CBC WITH DIFFERENTIAL/PLATELET - Abnormal; Notable for the following components:      Result Value   WBC 11.2 (*)    Neutro Abs 8.3 (*)    All other components within normal limits  PREGNANCY, URINE  COMPREHENSIVE METABOLIC PANEL    EKG None  Radiology CT Cervical Spine Wo Contrast  Result Date: 05/02/2021 CLINICAL DATA:  Neck and left shoulder pain since wrestling injury 6 days ago. Now with diffuse numbness of the left forearm and hand. EXAM: CT CERVICAL SPINE WITHOUT CONTRAST CT THORACIC SPINE WITHOUT CONTRAST TECHNIQUE: Multidetector CT imaging of the cervical and thoracic spine was performed without contrast. Multiplanar CT image reconstructions were also generated. RADIATION DOSE REDUCTION: This exam was performed according to the departmental dose-optimization program which includes automated exposure control, adjustment of the mA and/or kV according to patient size and/or use of iterative reconstruction technique. COMPARISON:  Cervical and thoracic spine x-rays dated April 30, 2021. FINDINGS: CT CERVICAL SPINE FINDINGS Alignment: Straightening of the normal cervical lordosis. No traumatic malalignment. Skull base and vertebrae: No acute fracture. No primary bone lesion or focal pathologic process. Soft tissues and spinal canal: No prevertebral fluid or swelling. No visible canal hematoma. Disc levels: Disc heights are preserved. No high-grade spinal canal or neuroforaminal stenosis. Upper chest: Negative. Other: None. CT THORACIC SPINE FINDINGS Alignment: Slight levocurvature.  No listhesis. Vertebrae: No acute fracture or focal pathologic process. Paraspinal and other soft tissues: Negative. Disc levels: Disc heights are preserved. No high-grade spinal canal or neuroforaminal stenosis. IMPRESSION: 1. No acute osseous abnormality of the  cervical or thoracic spine. Electronically Signed   By: Titus Dubin M.D.   On: 05/02/2021 11:00   CT Thoracic Spine Wo Contrast  Result Date: 05/02/2021 CLINICAL DATA:  Neck and left shoulder pain since wrestling injury 6 days ago. Now with diffuse numbness of the left forearm and hand. EXAM: CT CERVICAL SPINE WITHOUT CONTRAST CT THORACIC SPINE WITHOUT CONTRAST TECHNIQUE: Multidetector CT imaging of the cervical and thoracic spine was performed without contrast. Multiplanar CT image reconstructions were also generated. RADIATION DOSE REDUCTION: This exam was performed according to the departmental dose-optimization program which includes automated exposure control, adjustment of the mA and/or kV according to patient size and/or use of iterative reconstruction technique. COMPARISON:  Cervical and thoracic spine x-rays dated April 30, 2021. FINDINGS: CT CERVICAL SPINE FINDINGS Alignment: Straightening of the normal cervical lordosis. No traumatic malalignment. Skull base  and vertebrae: No acute fracture. No primary bone lesion or focal pathologic process. Soft tissues and spinal canal: No prevertebral fluid or swelling. No visible canal hematoma. Disc levels: Disc heights are preserved. No high-grade spinal canal or neuroforaminal stenosis. Upper chest: Negative. Other: None. CT THORACIC SPINE FINDINGS Alignment: Slight levocurvature.  No listhesis. Vertebrae: No acute fracture or focal pathologic process. Paraspinal and other soft tissues: Negative. Disc levels: Disc heights are preserved. No high-grade spinal canal or neuroforaminal stenosis. IMPRESSION: 1. No acute osseous abnormality of the cervical or thoracic spine. Electronically Signed   By: Titus Dubin M.D.   On: 05/02/2021 11:00   DG Shoulder Left  Result Date: 05/02/2021 CLINICAL DATA:  Injury.  Swelling EXAM: LEFT SHOULDER - 2+ VIEW COMPARISON:  None. FINDINGS: There is no evidence of fracture or dislocation. There is no evidence of  arthropathy or other focal bone abnormality. Soft tissues are unremarkable. IMPRESSION: Negative. Electronically Signed   By: Franchot Gallo M.D.   On: 05/02/2021 10:47     Procedures Procedures    Medications Ordered in ED Medications  diazepam (VALIUM) tablet 5 mg (5 mg Oral Given 05/02/21 1039)  ketorolac (TORADOL) 15 MG/ML injection 15 mg (15 mg Intravenous Given 05/02/21 1136)    ED Course/ Medical Decision Making/ A&P                           Medical Decision Making Amount and/or Complexity of Data Reviewed Labs: ordered. Radiology: ordered.  Risk Prescription drug management.   21 year old female presents emerged department for evaluation of neck pain.  Differential diagnosis includes was not limited to muscle spasm, ligamentous injury, radiculopathy, cervical compression fracture, spinal subluxation, compartment syndrome.  Vital signs are stable although patient mildly bradycardic which is consistent from some previous ED visits.  Physical exam shows a palpable individual muscle spasm to the left trap.  No overlying skin changes noted to the area.  The patient has compartments and 2+ radial pulses with cap refill intact.  She has subjective decrease sensation to the left upper extremity in the forearm area but sensation to the shoulder.  Its not consistent with any dermatomal pattern. She has limited range of motion of her neck due to pain but has some flexion extension and rotation.  At this time, I will suspicion for any meningitis as the patient has a known mechanism of injury and does not have any other fever or URI symptoms.  I was concerned due to mechanism of injury and the patient's palpable muscle spasm as well as some numbness, my attending physician assessed at bedside and agreed with the plan to continue with the CT cervical and thoracic spine.  He did not see need for any emergent MRI and that she could follow-up outpatient if needed.  The patient previously had  C-spine and T-spine images in urgent care that were normal.  Given the pain and duration of symptoms, will order CT cervical and thoracic spine films.  Will order basic labs as well.  I independently reviewed the patient's labs and imaging and agree with radiologist interpretations.  CMP shows mildly elevated creatinine at 1.03 although there is no other electrolyte abnormalities.  CBC shows mildly elevated white blood cell count at 11.2 although this could be from stress in the body not concerned for anything infectious.  Pregnancy test negative. CT shows no acute osseous abnormality of the cervical or thoracic spine.  Disc heights are preserved and there  is no high-grade spinal canal or neuroforaminal stenosis present.  There is some straightening of the normal cervical lordosis but no traumatic malalignment.  X-ray of the left shoulder shows no evidence of fracture dislocation there is no evidence of any arthropathy or any focal bone abnormality and soft tissues are unremarkable.  Given the patient's muscle spasm, I gave her a Valium to help relieve some of the spasm in addition to some Toradol.  On reevaluation, the patient reports she is feeling much better after the Valium and that she is able to move her head/neck more.  I rediscussed the case with my attending physician who reports the patient is safe for discharge home and follow-up with Dr. Raeford Razor for possible outpatient MRI if symptoms not improving.  I discussed the normal imaging findings with the patient.  I recommended her follow-up with a nonsurgical orthopedic provider such as Dr. Clearance Coots.  I gave her the number while in the emergency department and she schedule an appointment for 05-07-2021 for follow-up with him.  I advised her to stay compliant with the naproxen and muscle laxer as this will ultimately help her.  Reports to take them as directed.  I updated and gave her a few more days off of work to help relax the muscle.   Recommended gentle stretching.  Strict return precautions were discussed.  Patient agrees to plan.  Patient is stable and being discharged home in good condition.   Final Clinical Impression(s) / ED Diagnoses Final diagnoses:  Acute strain of neck muscle, sequela    Rx / DC Orders ED Discharge Orders     None         Sherrell Puller, PA-C 05/06/21 0004    Fredia Sorrow, MD 05/09/21 2352

## 2021-05-02 NOTE — ED Notes (Addendum)
Pt in bed, pt reports increased movement in her L arm, pt reports decreased pain.  Pt states that she is ready to go home

## 2021-05-02 NOTE — ED Notes (Signed)
Patient transported to CT 

## 2021-05-02 NOTE — ED Triage Notes (Signed)
Pt to er, pt states that she was seen here on Friday for the same thing, states that she was wrestling on Friday and had some neck and shoulder pain after, states that she came here and was told that it was a sprain, states that it kept hurting so she went to urgent care and was told it was a sprain, states that it seems to be getting worse, states that she now has tingling in her L arm so she came here.  Pt states that she can't move or feel on her L arm.

## 2021-05-02 NOTE — Discharge Instructions (Addendum)
You were seen in today for evaluation of your neck pain.  Your CT imaging did not show any abnormality.  You were given naproxen and Robaxin previously, please stay compliant to these medications you will need to resume taking them in order to see improvement of your symptoms.  Additionally, I have sent you a referral to Dr. Cyndia Diver office for you to see him as soon as possible.  Can try heat packs like the rice sock is described to you previously or ice, whichever one feels better to you.  If you have any worsening numbness, tingling, weakness, temperature change, discoloration, lightheadedness, crest pain, or shortness of breath, please return to the nearest emergency department for reevaluation.

## 2021-05-02 NOTE — ED Notes (Signed)
ED Provider at bedside. 

## 2021-05-07 ENCOUNTER — Ambulatory Visit (INDEPENDENT_AMBULATORY_CARE_PROVIDER_SITE_OTHER): Payer: Medicaid Other | Admitting: Family Medicine

## 2021-05-07 ENCOUNTER — Ambulatory Visit: Payer: Self-pay

## 2021-05-07 ENCOUNTER — Encounter: Payer: Self-pay | Admitting: Family Medicine

## 2021-05-07 ENCOUNTER — Other Ambulatory Visit (HOSPITAL_BASED_OUTPATIENT_CLINIC_OR_DEPARTMENT_OTHER): Payer: Self-pay

## 2021-05-07 VITALS — BP 126/80 | Ht 61.0 in | Wt 153.0 lb

## 2021-05-07 DIAGNOSIS — M60812 Other myositis, left shoulder: Secondary | ICD-10-CM | POA: Diagnosis not present

## 2021-05-07 DIAGNOSIS — M898X1 Other specified disorders of bone, shoulder: Secondary | ICD-10-CM

## 2021-05-07 DIAGNOSIS — M5412 Radiculopathy, cervical region: Secondary | ICD-10-CM | POA: Insufficient documentation

## 2021-05-07 MED ORDER — HYDROCODONE-ACETAMINOPHEN 5-325 MG PO TABS
1.0000 | ORAL_TABLET | Freq: Three times a day (TID) | ORAL | 0 refills | Status: AC | PRN
  Filled 2021-05-07: qty 15, 5d supply, fill #0

## 2021-05-07 NOTE — Patient Instructions (Signed)
Nice to meet you Please continue the sling  Please use the pain medicine as needed  I will call with the results from today   Please send me a message in MyChart with any questions or updates.  Follow up will depend on the results.   --Dr. Jordan Likes

## 2021-05-07 NOTE — Assessment & Plan Note (Signed)
Acutely occurring.  Has tenderness as well as increased thickness of the supraspinatus which points to more to an inflammatory source.  She did have a mild elevation of the white count but otherwise imaging has been normal until ultrasound today. -Counseled on home exercise therapy and supportive care. -CMP, CBC with differential, sed rate, CRP, CK, aldolase and LDH. -Norco. -May need to consider further imaging

## 2021-05-07 NOTE — Progress Notes (Signed)
°  [place] - 21 y.o. female MRN [id number]  Date of birth: [birth year]  SUBJECTIVE:  Including CC & ROS.  No chief complaint on file.   Amy Kemp is a 21 y.o. female that is presenting with acute left periscapular pain.  This pain is overlying the scapula with tenderness to palpation.  She has limited range of motion secondary to pain.  No inciting event or trauma.  Denies any night sweats or fevers.  No illicit drug use.  No recent surgery.  Been ongoing for about a month.  Has been using a sling.  No improvement with medications thus far.  Review of the emergency department note from 2/5 shows counseled on over-the-counter medications. Review of the note from 2/7 shows she was provided naproxen and Flexeril. Review from the emergency department note from 2/9 shows she was advised on naproxen and muscle relaxer. Review of the cervical spine x-ray from 2/11 shows no acute changes. Review of the thoracic spine x-ray from 2/7 shows no acute changes. Independent review of the CT thoracic spine and cervical spine from 2/9 shows no acute changes. Independent review of the left shoulder x-ray from 2/9 shows no acute changes.  Review of Systems See HPI   HISTORY: Past Medical, Surgical, Social, and Family History Reviewed & Updated per EMR.   Pertinent Historical Findings include:  Past Medical History:  Diagnosis Date   Nasal congestion     Past Surgical History:  Procedure Laterality Date   ADENOIDECTOMY     ADENOIDECTOMY     NASAL SEPTUM SURGERY       PHYSICAL EXAM:  VS: BP 126/80 (BP Location: Right Arm, Patient Position: Sitting)    Ht 5\' 1"  (1.549 m)    Wt 153 lb (69.4 kg)    BMI 28.91 kg/m  Physical Exam Gen: NAD, alert, cooperative with exam, well-appearing MSK:  Neurovascularly intact    Limited ultrasound: Left shoulder:  The supraspinatus appears to be thickened at the level of the scapular spine when compared to the contralateral side. Infraspinatus is  symmetric bilaterally. There is increased hyperemia over the overlying pectoralis major when looking at the subscapularis. Mild effusion of the bicipital tendon sheath.  Summary: Findings consistent with thickening of the supraspinatus on the left.  Ultrasound and interpretation by Clearance Coots, MD    ASSESSMENT & PLAN:   Myositis of left shoulder Acutely occurring.  Has tenderness as well as increased thickness of the supraspinatus which points to more to an inflammatory source.  She did have a mild elevation of the white count but otherwise imaging has been normal until ultrasound today. -Counseled on home exercise therapy and supportive care. -CMP, CBC with differential, sed rate, CRP, CK, aldolase and LDH. -Norco. -May need to consider further imaging

## 2021-05-08 ENCOUNTER — Telehealth: Payer: Self-pay | Admitting: Family Medicine

## 2021-05-08 MED ORDER — PREDNISONE 5 MG PO TABS: ORAL_TABLET | ORAL | 0 refills | Status: AC

## 2021-05-08 NOTE — Telephone Encounter (Signed)
Left VM for patient. If she calls back please have her speak with a nurse/CMA and inform that her lab work is reassuring. We'll try some prednisone.Do not take with naproxen. She can follow up in one week.   If any questions then please take the best time and phone number to call and I will try to call her back.   Myra Rude, MD [facility] 05/08/2021, 3:41 PM

## 2021-05-08 NOTE — Telephone Encounter (Signed)
Pt informed of below.  OV scheduled 05/15/21.

## 2021-05-11 LAB — COMPREHENSIVE METABOLIC PANEL
ALT: 13 IU/L (ref 0–32)
AST: 18 IU/L (ref 0–40)
Albumin/Globulin Ratio: 1.5 (ref 1.2–2.2)
Albumin: 4.5 g/dL (ref 3.9–5.0)
Alkaline Phosphatase: 70 IU/L (ref 44–121)
BUN/Creatinine Ratio: 17 (ref 9–23)
BUN: 16 mg/dL (ref 6–20)
Bilirubin Total: 0.3 mg/dL (ref 0.0–1.2)
CO2: 22 mmol/L (ref 20–29)
Calcium: 9.5 mg/dL (ref 8.7–10.2)
Chloride: 104 mmol/L (ref 96–106)
Creatinine, Ser: 0.95 mg/dL (ref 0.57–1.00)
Globulin, Total: 3 g/dL (ref 1.5–4.5)
Glucose: 82 mg/dL (ref 70–99)
Potassium: 4.6 mmol/L (ref 3.5–5.2)
Sodium: 141 mmol/L (ref 134–144)
Total Protein: 7.5 g/dL (ref 6.0–8.5)
eGFR: 87 mL/min/{1.73_m2} (ref >59)

## 2021-05-11 LAB — CBC WITH DIFFERENTIAL
Basophils Absolute: 0 10*3/uL (ref 0.0–0.2)
Basos: 1 %
EOS (ABSOLUTE): 0.1 10*3/uL (ref 0.0–0.4)
Eos: 2 %
Hematocrit: 39.1 % (ref 34.0–46.6)
Hemoglobin: 13.3 g/dL (ref 11.1–15.9)
Immature Grans (Abs): 0 10*3/uL (ref 0.0–0.1)
Immature Granulocytes: 0 %
Lymphocytes Absolute: 1.8 10*3/uL (ref 0.7–3.1)
Lymphs: 24 %
MCH: 31.7 pg (ref 26.6–33.0)
MCHC: 34 g/dL (ref 31.5–35.7)
MCV: 93 fL (ref 79–97)
Monocytes Absolute: 0.5 10*3/uL (ref 0.1–0.9)
Monocytes: 7 %
Neutrophils Absolute: 4.8 10*3/uL (ref 1.4–7.0)
Neutrophils: 66 %
RBC: 4.2 x10E6/uL (ref 3.77–5.28)
RDW: 13.6 % (ref 11.7–15.4)
WBC: 7.3 10*3/uL (ref 3.4–10.8)

## 2021-05-11 LAB — ANA,IFA RA DIAG PNL W/RFLX TIT/PATN
ANA Titer 1: NEGATIVE
Cyclic Citrullin Peptide Ab: 2 units (ref 0–19)
Rheumatoid fact SerPl-aCnc: <10 IU/mL (ref <14.0)

## 2021-05-11 LAB — ALDOLASE: Aldolase: 2.9 U/L — ABNORMAL LOW (ref 3.3–10.3)

## 2021-05-11 LAB — C-REACTIVE PROTEIN: CRP: 2 mg/L (ref 0–10)

## 2021-05-11 LAB — SEDIMENTATION RATE: Sed Rate: 23 mm/hr (ref 0–32)

## 2021-05-11 LAB — LACTATE DEHYDROGENASE: LDH: 188 IU/L (ref 119–226)

## 2021-05-11 LAB — CK: Total CK: 159 U/L (ref 32–182)

## 2021-05-15 ENCOUNTER — Encounter: Payer: Self-pay | Admitting: Family Medicine

## 2021-05-15 ENCOUNTER — Other Ambulatory Visit (HOSPITAL_BASED_OUTPATIENT_CLINIC_OR_DEPARTMENT_OTHER): Payer: Self-pay

## 2021-05-15 ENCOUNTER — Ambulatory Visit (INDEPENDENT_AMBULATORY_CARE_PROVIDER_SITE_OTHER): Payer: Medicaid Other | Admitting: Family Medicine

## 2021-05-15 VITALS — BP 122/70 | Ht 61.0 in | Wt 153.0 lb

## 2021-05-15 DIAGNOSIS — M5412 Radiculopathy, cervical region: Secondary | ICD-10-CM

## 2021-05-15 MED ORDER — GABAPENTIN 300 MG PO CAPS
300.0000 mg | ORAL_CAPSULE | Freq: Three times a day (TID) | ORAL | 1 refills | Status: AC
  Filled 2021-05-15: qty 90, 30d supply, fill #0
  Filled 2021-10-10: qty 90, 30d supply, fill #1

## 2021-05-15 NOTE — Progress Notes (Signed)
°  Amy Kemp - 21 y.o. female MRN [id number]  Date of birth: 11/01/2000  SUBJECTIVE:  Including CC & ROS.  No chief complaint on file.   Amy Kemp is a 21 y.o. female that is presenting with worsening of her left shoulder, neck and back pain.  Her lab results were unrevealing for infection or inflammatory origin.  She continues to have pain intermittently.  The pain is random.    Review of Systems See HPI   HISTORY: Past Medical, Surgical, Social, and Family History Reviewed & Updated per EMR.   Pertinent Historical Findings include:  Past Medical History:  Diagnosis Date   Nasal congestion     Past Surgical History:  Procedure Laterality Date   ADENOIDECTOMY     ADENOIDECTOMY     NASAL SEPTUM SURGERY       PHYSICAL EXAM:  VS: BP 122/70 (BP Location: Right Arm, Patient Position: Sitting)    Ht 5\' 1"  (1.549 m)    Wt 153 lb (69.4 kg)    BMI 28.91 kg/m  Physical Exam Gen: NAD, alert, cooperative with exam, well-appearing MSK:  Neurovascularly intact       ASSESSMENT & PLAN:   Cervical radiculopathy Acutely occurring.  Lab work unrevealing for a source.  Symptoms most consistent with radicular pain at this time. -Counseled on home exercise therapy and supportive care. -Gabapentin. -Could consider physical therapy or further imaging.

## 2021-05-15 NOTE — Patient Instructions (Signed)
Good to see you Please try heat on the area  Please start the gabapentin at night. You can increase to 2 or 3 times daily as you tolerate   Please send me a message in MyChart with any questions or updates.  Please see me back in 2 weeks.   --Dr. Jordan Likes

## 2021-05-15 NOTE — Assessment & Plan Note (Signed)
Acutely occurring.  Lab work unrevealing for a source.  Symptoms most consistent with radicular pain at this time. -Counseled on home exercise therapy and supportive care. -Gabapentin. -Could consider physical therapy or further imaging.
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
## 2021-05-31 ENCOUNTER — Encounter: Payer: Self-pay | Admitting: Family Medicine

## 2021-05-31 ENCOUNTER — Ambulatory Visit (INDEPENDENT_AMBULATORY_CARE_PROVIDER_SITE_OTHER): Payer: Medicaid Other | Admitting: Family Medicine

## 2021-05-31 VITALS — BP 120/82 | Ht 61.0 in | Wt 153.0 lb

## 2021-05-31 DIAGNOSIS — M5412 Radiculopathy, cervical region: Secondary | ICD-10-CM

## 2021-05-31 NOTE — Assessment & Plan Note (Signed)
Acutely occurring.  Still having pain in the left neck and down the arm.  Does have improvement with less frequent pain. ?-Counseled on home exercise therapy and supportive care. ?-Referral to physical therapy. ?-Provided work note. ?-Could consider further imaging. ?

## 2021-05-31 NOTE — Patient Instructions (Signed)
Good to see you Please try physical therapy   Please send me a message in MyChart with any questions or updates.  Please see me back in 4 weeks.   --Dr. Nakina Spatz  

## 2021-05-31 NOTE — Progress Notes (Signed)
?  Amy Kemp - 21 y.o. female MRN [id number]  Date of birth: Apr 19, 2000 ? ?SUBJECTIVE:  Including CC & ROS.  ?No chief complaint on file. ? ? ?Amy Kemp is a 21 y.o. female that is having ongoing left shoulder and neck pain.  She wears a sling intermittently.  She notices the pain down her arm when she is more active. ? ? ?Review of Systems ?See HPI  ? ?HISTORY: Past Medical, Surgical, Social, and Family History Reviewed & Updated per EMR.   ?Pertinent Historical Findings include: ? ?Past Medical History:  ?Diagnosis Date  ? Nasal congestion   ? ? ?Past Surgical History:  ?Procedure Laterality Date  ? ADENOIDECTOMY    ? ADENOIDECTOMY    ? NASAL SEPTUM SURGERY    ? ? ? ?PHYSICAL EXAM:  ?VS: BP 120/82 (BP Location: Right Arm, Patient Position: Sitting)   Ht 5\' 1"  (1.549 m)   Wt 153 lb (69.4 kg)   BMI 28.91 kg/m?  ?Physical Exam ?Gen: NAD, alert, cooperative with exam, well-appearing ?MSK:  ?Neurovascularly intact   ? ? ? ? ?ASSESSMENT & PLAN:  ? ?Cervical radiculopathy ?Acutely occurring.  Still having pain in the left neck and down the arm.  Does have improvement with less frequent pain. ?-Counseled on home exercise therapy and supportive care. ?-Referral to physical therapy. ?-Provided work note. ?-Could consider further imaging. ? ? ? ? ?

## 2021-06-24 ENCOUNTER — Ambulatory Visit: Payer: Medicaid Other | Admitting: Family Medicine

## 2021-07-10 ENCOUNTER — Ambulatory Visit: Payer: No Typology Code available for payment source | Attending: Family Medicine | Admitting: Physical Therapy

## 2021-07-15 ENCOUNTER — Ambulatory Visit: Payer: No Typology Code available for payment source | Admitting: Physical Therapy

## 2021-07-24 ENCOUNTER — Encounter: Payer: Medicaid Other | Admitting: Physical Therapy

## 2021-07-31 ENCOUNTER — Encounter: Payer: Medicaid Other | Admitting: Physical Therapy

## 2021-08-07 ENCOUNTER — Encounter: Payer: Medicaid Other | Admitting: Physical Therapy

## 2021-10-10 ENCOUNTER — Other Ambulatory Visit (HOSPITAL_BASED_OUTPATIENT_CLINIC_OR_DEPARTMENT_OTHER): Payer: Self-pay

## 2021-10-11 ENCOUNTER — Other Ambulatory Visit (HOSPITAL_BASED_OUTPATIENT_CLINIC_OR_DEPARTMENT_OTHER): Payer: Self-pay

## 2021-11-29 ENCOUNTER — Emergency Department (HOSPITAL_BASED_OUTPATIENT_CLINIC_OR_DEPARTMENT_OTHER)
Admission: EM | Admit: 2021-11-29 | Discharge: 2021-11-29 | Disposition: A | Discharge status: Home / Self Care | Payer: Medicaid Other | Attending: Emergency Medicine | Admitting: Emergency Medicine

## 2021-11-29 ENCOUNTER — Other Ambulatory Visit: Payer: Self-pay

## 2021-11-29 ENCOUNTER — Emergency Department (HOSPITAL_BASED_OUTPATIENT_CLINIC_OR_DEPARTMENT_OTHER): Payer: Medicaid Other

## 2021-11-29 ENCOUNTER — Encounter (HOSPITAL_BASED_OUTPATIENT_CLINIC_OR_DEPARTMENT_OTHER): Payer: Self-pay

## 2021-11-29 DIAGNOSIS — X58XXXA Exposure to other specified factors, initial encounter: Secondary | ICD-10-CM | POA: Insufficient documentation

## 2021-11-29 DIAGNOSIS — S43402A Unspecified sprain of left shoulder joint, initial encounter: Secondary | ICD-10-CM

## 2021-11-29 DIAGNOSIS — S4992XA Unspecified injury of left shoulder and upper arm, initial encounter: Secondary | ICD-10-CM | POA: Diagnosis present

## 2021-11-29 NOTE — ED Notes (Signed)
Presents with left shoulder pain, very tender to palpation, has strong left hand grip, strong left radial pulse, capillary refill WNL. Limited ROM due to pain at left shoulder, pain begins at post area and radiates to ant of left shoulder.

## 2021-11-29 NOTE — ED Notes (Signed)
Left Arm Sling applied for comfort, ice pack also provided. Pt teaching done re: using sling , ice pack , rest for pain control
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
## 2022-07-10 ENCOUNTER — Encounter: Payer: Self-pay | Admitting: *Deleted

## 2022-11-10 ENCOUNTER — Emergency Department (HOSPITAL_BASED_OUTPATIENT_CLINIC_OR_DEPARTMENT_OTHER)
Admission: EM | Admit: 2022-11-10 | Discharge: 2022-11-10 | Disposition: A | Discharge status: Home / Self Care | Payer: Medicaid Other | Attending: Emergency Medicine | Admitting: Emergency Medicine

## 2022-11-10 ENCOUNTER — Encounter (HOSPITAL_BASED_OUTPATIENT_CLINIC_OR_DEPARTMENT_OTHER): Payer: Self-pay

## 2022-11-10 ENCOUNTER — Other Ambulatory Visit: Payer: Self-pay

## 2022-11-10 ENCOUNTER — Emergency Department (HOSPITAL_BASED_OUTPATIENT_CLINIC_OR_DEPARTMENT_OTHER): Payer: Medicaid Other

## 2022-11-10 DIAGNOSIS — N390 Urinary tract infection, site not specified: Secondary | ICD-10-CM

## 2022-11-10 DIAGNOSIS — R1012 Left upper quadrant pain: Secondary | ICD-10-CM | POA: Insufficient documentation

## 2022-11-10 DIAGNOSIS — K529 Noninfective gastroenteritis and colitis, unspecified: Secondary | ICD-10-CM | POA: Diagnosis not present

## 2022-11-10 LAB — COMPREHENSIVE METABOLIC PANEL
ALT: 22 U/L (ref 0–44)
AST: 24 U/L (ref 15–41)
Albumin: 4.1 g/dL (ref 3.5–5.0)
Alkaline Phosphatase: 60 U/L (ref 38–126)
Anion gap: 10 (ref 5–15)
BUN: 17 mg/dL (ref 6–20)
CO2: 22 mmol/L (ref 22–32)
Calcium: 9.1 mg/dL (ref 8.9–10.3)
Chloride: 102 mmol/L (ref 98–111)
Creatinine, Ser: 0.94 mg/dL (ref 0.44–1.00)
GFR, Estimated: >60 mL/min (ref >60)
Glucose, Bld: 101 mg/dL — ABNORMAL HIGH (ref 70–99)
Potassium: 3.8 mmol/L (ref 3.5–5.1)
Sodium: 134 mmol/L — ABNORMAL LOW (ref 135–145)
Total Bilirubin: 0.6 mg/dL (ref 0.3–1.2)
Total Protein: 7.9 g/dL (ref 6.5–8.1)

## 2022-11-10 LAB — URINALYSIS, MICROSCOPIC (REFLEX)

## 2022-11-10 LAB — CBC
HCT: 37.1 % (ref 36.0–46.0)
Hemoglobin: 12.5 g/dL (ref 12.0–15.0)
MCH: 30.9 pg (ref 26.0–34.0)
MCHC: 33.7 g/dL (ref 30.0–36.0)
MCV: 91.6 fL (ref 80.0–100.0)
Platelets: 225 10*3/uL (ref 150–400)
RBC: 4.05 MIL/uL (ref 3.87–5.11)
RDW: 14.6 % (ref 11.5–15.5)
WBC: 10 10*3/uL (ref 4.0–10.5)
nRBC: 0 % (ref 0.0–0.2)

## 2022-11-10 LAB — URINALYSIS, ROUTINE W REFLEX MICROSCOPIC
Bilirubin Urine: NEGATIVE
Glucose, UA: NEGATIVE mg/dL
Hgb urine dipstick: NEGATIVE
Ketones, ur: NEGATIVE mg/dL
Nitrite: NEGATIVE
Protein, ur: NEGATIVE mg/dL
Specific Gravity, Urine: 1.025 (ref 1.005–1.030)
pH: 5.5 (ref 5.0–8.0)

## 2022-11-10 LAB — LIPASE, BLOOD: Lipase: 26 U/L (ref 11–51)

## 2022-11-10 LAB — PREGNANCY, URINE: Preg Test, Ur: NEGATIVE

## 2022-11-10 MED ORDER — ONDANSETRON 4 MG PO TBDP: 4.0000 mg | ORAL_TABLET | Freq: Three times a day (TID) | ORAL | 0 refills | Status: AC | PRN

## 2022-11-10 MED ORDER — IOHEXOL 300 MG/ML  SOLN
100.0000 mL | Freq: Once | INTRAMUSCULAR | Status: AC | PRN
  Administered 2022-11-10: 100 mL via INTRAVENOUS

## 2022-11-10 MED ORDER — NITROFURANTOIN MONOHYD MACRO 100 MG PO CAPS: 100.0000 mg | ORAL_CAPSULE | Freq: Two times a day (BID) | ORAL | 0 refills | Status: AC

## 2022-11-10 MED ORDER — DICYCLOMINE HCL 20 MG PO TABS: 20.0000 mg | ORAL_TABLET | Freq: Two times a day (BID) | ORAL | 0 refills | Status: AC

## 2022-11-10 NOTE — ED Notes (Signed)
D/c paperwork reviewed with pt, including prescriptions and follow up care.  All questions and/or concerns addressed at time of d/c.  No further needs expressed. . Pt verbalized understanding, Ambulatory with family to ED exit, NAD.   

## 2022-11-10 NOTE — ED Triage Notes (Signed)
N/V/D after eating x1 month

## 2022-11-10 NOTE — ED Provider Notes (Signed)
[facility] Provider Note   CSN: [id number] Arrival date & time: 11/10/22  1723    History  Chief Complaint  Patient presents with   Abdominal Pain   Nausea    Amy Kemp is a 22 y.o. female here for evaluation abdominal pain.  Gets cramping left-sided abdominal pain and loose stool whenever she eats.  No blood in her stool.  Unsure of family history of IBD.  Occasionally has nausea and vomiting.  No pain to right upper quadrant.  She has occasional pain to her epigastric region.  No fever, chest pain, shortness of breath, dysuria or hematuria.  No discharge or concern for STD.  Initially thought sx were related to dairy products, she stopped eating dairy however symptoms persisted.  HPI     Home Medications Prior to Admission medications   Medication Sig Start Date End Date Taking? Authorizing Provider  dicyclomine (BENTYL) 20 MG tablet Take 1 tablet (20 mg total) by mouth 2 (two) times daily. 11/10/22  Yes Gricel Copen A, PA-C  nitrofurantoin, macrocrystal-monohydrate, (MACROBID) 100 MG capsule Take 1 capsule (100 mg total) by mouth 2 (two) times daily. 11/10/22  Yes Jonhatan Hearty A, PA-C  ondansetron (ZOFRAN-ODT) 4 MG disintegrating tablet Take 1 tablet (4 mg total) by mouth every 8 (eight) hours as needed. 11/10/22  Yes Manoj Enriquez A, PA-C  cetirizine (ZYRTEC) 10 MG tablet Take 10 mg by mouth daily as needed. 07/13/19   [provider]  gabapentin (NEURONTIN) 300 MG capsule Take 1 capsule (300 mg total) by mouth 3 (three) times daily. 05/15/21   Myra Rude, MD  HYDROcodone-acetaminophen (NORCO/VICODIN) 5-325 MG tablet Take 1 tablet by mouth every 8 (eight) hours as needed. 05/07/21   Myra Rude, MD  methocarbamol (ROBAXIN) 500 MG tablet Take 1 tablet (500 mg total) by mouth 2 (two) times daily. 05/28/20   Maxwell Caul, PA-C  naproxen (NAPROSYN) 500 MG tablet Take 1 tablet (500 mg total) by mouth 2  (two) times daily. 11/01/19   Horton, Mayer Masker, MD  predniSONE (DELTASONE) 5 MG tablet Take 6 pills for first day, 5 pills second day, 4 pills third day, 3 pills fourth day, 2 pills the fifth day, and 1 pill sixth day. 05/08/21   Myra Rude, MD      Allergies    Patient has no known allergies.    Review of Systems   Review of Systems  Constitutional: Negative.   HENT: Negative.    Respiratory: Negative.    Cardiovascular: Negative.   Gastrointestinal:  Positive for abdominal pain, diarrhea, nausea and vomiting. Negative for abdominal distention, anal bleeding, blood in stool, constipation and rectal pain.  Genitourinary: Negative.   Musculoskeletal: Negative.   Skin: Negative.   Neurological: Negative.   All other systems reviewed and are negative.   Physical Exam Updated Vital Signs BP 121/79 (BP Location: Left Arm)   Pulse 73   Temp 98 F (36.7 C)   Resp 16   Ht 5\' 2"  (1.575 m)   Wt 68 kg   SpO2 100%   BMI 27.44 kg/m  Physical Exam Vitals and nursing note reviewed.  Constitutional:      General: She is not in acute distress.    Appearance: She is well-developed. She is not ill-appearing, toxic-appearing or diaphoretic.  HENT:     Head: Atraumatic.  Eyes:     Pupils: Pupils are equal, round, and reactive to light.  Cardiovascular:  Rate and Rhythm: Normal rate.     Heart sounds: Normal heart sounds.  Pulmonary:     Effort: Pulmonary effort is normal. No respiratory distress.     Breath sounds: Normal breath sounds.  Abdominal:     General: Bowel sounds are normal. There is no distension.     Palpations: Abdomen is soft.     Tenderness: There is abdominal tenderness in the epigastric area, left upper quadrant and left lower quadrant. There is no right CVA tenderness, left CVA tenderness, guarding or rebound. Negative signs include Murphy's sign and McBurney's sign.     Hernia: No hernia is present.  Musculoskeletal:        General: Normal range of  motion.     Cervical back: Normal range of motion.  Skin:    General: Skin is warm and dry.  Neurological:     General: No focal deficit present.     Mental Status: She is alert.  Psychiatric:        Mood and Affect: Mood normal.     ED Results / Procedures / Treatments   Labs (all labs ordered are listed, but only abnormal results are displayed) Labs Reviewed  COMPREHENSIVE METABOLIC PANEL - Abnormal; Notable for the following components:      Result Value   Sodium 134 (*)    Glucose, Bld 101 (*)    All other components within normal limits  URINALYSIS, ROUTINE W REFLEX MICROSCOPIC - Abnormal; Notable for the following components:   APPearance CLOUDY (*)    Leukocytes,Ua TRACE (*)    All other components within normal limits  URINALYSIS, MICROSCOPIC (REFLEX) - Abnormal; Notable for the following components:   Bacteria, UA MANY (*)    All other components within normal limits  LIPASE, BLOOD  CBC  PREGNANCY, URINE    EKG None  Radiology CT ABDOMEN PELVIS W CONTRAST  Result Date: 11/10/2022 CLINICAL DATA:  Diarrhea, left lower quadrant abdominal pain. Concern for IBD. EXAM: CT ABDOMEN AND PELVIS WITH CONTRAST TECHNIQUE: Multidetector CT imaging of the abdomen and pelvis was performed using the standard protocol following bolus administration of intravenous contrast. RADIATION DOSE REDUCTION: This exam was performed according to the departmental dose-optimization program which includes automated exposure control, adjustment of the mA and/or kV according to patient size and/or use of iterative reconstruction technique. CONTRAST:  OMNIPAQUE IOHEXOL 300 MG/ML  SOLN COMPARISON:  None Available. FINDINGS: Lower chest: No acute abnormality. Hepatobiliary: Hepatic cysts. Normal gallbladder. No biliary dilation. Pancreas: Unremarkable. Spleen: Unremarkable. Adrenals/Urinary Tract: Normal adrenal glands. No urinary calculi or hydronephrosis. Bladder is unremarkable. Stomach/Bowel:  Normal caliber large and small bowel. Question mild wall thickening and hyperenhancement of the distal 2 cm of the terminal ileum (series 2/image 56) versus underdistention. The appendix is normal.Stomach is within normal limits. Vascular/Lymphatic: No significant vascular findings are present. No enlarged abdominal or pelvic lymph nodes. Reproductive: Uterus and bilateral adnexa are unremarkable. Other: No free intraperitoneal fluid or air. Musculoskeletal: No acute fracture. IMPRESSION: Question mild wall thickening and hyperenhancement of the distal 2 cm of the terminal ileum versus underdistention. Findings could reflect mild terminal ileitis. Electronically Signed   By: Minerva Fester M.D.   On: 11/10/2022 21:45    Procedures Procedures    Medications Ordered in ED Medications  iohexol (OMNIPAQUE) 300 MG/ML solution 100 mL (100 mLs Intravenous Contrast Given 11/10/22 2025)   ED Course/ Medical Decision Making/ A&P   22 year old here for evaluation 1 month of left-sided abdominal pain, diarrhea  after eating.  Typically has a bowel movement within 10 to 15 minutes of eating which is loose.  Occasionally has nausea and vomiting.  No pain to right upper quadrant.  No prior abdominal surgeries.  No urinary symptoms.  Denies chance of pregnancy.  Diffusely tender to left abdomen.  Will plan on labs, imaging and reassess  Labs and imaging personally viewed and interpreted:  Pregnancy test negative UA positive for UTI, CBC without leukocytosis Metabolic panel sodium 134 Lipase 26 CT abdomen pelvis possible ileitis  I discussed results with patient.  Does not want to start on steroids at this time.  Given Bentyl as needed for pain.  Discussed close follow-up with GI as well as PCP for workup of possible Crohn's disease.  Was also given Macrobid for UTI.  No flank pain to suggest pyelonephritis.  No pelvic pain, vaginal discharge to suspect STD.  Tolerating p.o. intake.  Will return for new or  worsening symptoms.  Patient is nontoxic, nonseptic appearing, in no apparent distress.  Patient's pain and other symptoms adequately managed in emergency department.  Fluid bolus given.  Labs, imaging and vitals reviewed.  Patient does not meet the SIRS or Sepsis criteria.  On repeat exam patient does not have a surgical abdomin and there are no peritoneal signs.  No indication of appendicitis, bowel obstruction, bowel perforation, cholecystitis, diverticulitis, PID, toa, torsion or ectopic pregnancy.  Patient discharged home with symptomatic treatment and given strict instructions for follow-up with their primary care physician.  I have also discussed reasons to return immediately to the ER.  Patient expresses understanding and agrees with plan.                                  Medical Decision Making Amount and/or Complexity of Data Reviewed Independent Historian: friend External Data Reviewed: labs, radiology and notes. Labs: ordered. Decision-making details documented in ED Course. Radiology: ordered and independent interpretation performed. Decision-making details documented in ED Course.  Risk OTC drugs. Prescription drug management. Diagnosis or treatment significantly limited by social determinants of health.          Final Clinical Impression(s) / ED Diagnoses Final diagnoses:  Lower urinary tract infectious disease  Left upper quadrant abdominal pain  Ileitis    Rx / DC Orders ED Discharge Orders          Ordered    dicyclomine (BENTYL) 20 MG tablet  2 times daily        11/10/22 2207    ondansetron (ZOFRAN-ODT) 4 MG disintegrating tablet  Every 8 hours PRN        11/10/22 2207    nitrofurantoin, macrocrystal-monohydrate, (MACROBID) 100 MG capsule  2 times daily        11/10/22 2207              Jaxtyn Linville A, PA-C 11/10/22 2208    Melene Plan, DO 11/10/22 2217

## 2022-11-10 NOTE — Discharge Instructions (Addendum)
As discussed in the room you need to follow-up with gastroenterologist.  The numbers listed in your discharge paperwork.  You need to call to schedule appointment.  I have her use the medication for your pain Bentyl, this helps with the abdominal pain May also take Imodium as needed for diarrhea Zofran as needed for nausea  Follow-up with primary care provider as they may further work you up for Crohn's disease

## 2024-04-06 ENCOUNTER — Emergency Department (HOSPITAL_BASED_OUTPATIENT_CLINIC_OR_DEPARTMENT_OTHER)
Admission: EM | Admit: 2024-04-06 | Discharge: 2024-04-06 | Disposition: A | Discharge status: Home / Self Care | Attending: Emergency Medicine | Admitting: Emergency Medicine

## 2024-04-06 ENCOUNTER — Other Ambulatory Visit: Payer: Self-pay

## 2024-04-06 ENCOUNTER — Encounter (HOSPITAL_BASED_OUTPATIENT_CLINIC_OR_DEPARTMENT_OTHER): Payer: Self-pay

## 2024-04-06 DIAGNOSIS — J069 Acute upper respiratory infection, unspecified: Secondary | ICD-10-CM | POA: Diagnosis not present

## 2024-04-06 DIAGNOSIS — Z79899 Other long term (current) drug therapy: Secondary | ICD-10-CM | POA: Diagnosis not present

## 2024-04-06 DIAGNOSIS — R059 Cough, unspecified: Secondary | ICD-10-CM | POA: Diagnosis present

## 2024-04-06 DIAGNOSIS — R030 Elevated blood-pressure reading, without diagnosis of hypertension: Secondary | ICD-10-CM | POA: Diagnosis not present

## 2024-04-06 LAB — RESP PANEL BY RT-PCR (RSV, FLU A&B, COVID)  RVPGX2
Influenza A by PCR: NEGATIVE
Influenza B by PCR: NEGATIVE
Resp Syncytial Virus by PCR: NEGATIVE
SARS Coronavirus 2 by RT PCR: NEGATIVE

## 2024-04-06 LAB — GROUP A STREP BY PCR: Group A Strep by PCR: NOT DETECTED

## 2024-04-06 MED ORDER — ACETAMINOPHEN 500 MG PO TABS
1000.0000 mg | ORAL_TABLET | Freq: Once | ORAL | Status: AC
  Administered 2024-04-06: 1000 mg via ORAL
  Filled 2024-04-06: qty 2

## 2024-04-06 NOTE — ED Provider Notes (Addendum)
 " [place] [facility] AT [facility] Provider Note   CSN: [id number] Arrival date & time: 04/06/24  1651     Patient presents with: Sore Throat   Amy Kemp is a 24 y.o. female.   24 year old female previously healthy presents to the emergency department with URI symptoms since last night.  Send cough, congestion, headache, chills, myalgias.  Denies any fevers or chest pain or shortness of breath.  Also has had a sore throat.       Prior to Admission medications  Medication Sig Start Date End Date Taking? Authorizing Provider  cetirizine (ZYRTEC) 10 MG tablet Take 10 mg by mouth daily as needed. 07/13/19   [provider]  dicyclomine  (BENTYL ) 20 MG tablet Take 1 tablet (20 mg total) by mouth 2 (two) times daily. 11/10/22   Henderly, Britni A, PA-C  gabapentin  (NEURONTIN ) 300 MG capsule Take 1 capsule (300 mg total) by mouth 3 (three) times daily. 05/15/21   Chick Venetia BRAVO, MD  HYDROcodone -acetaminophen  (NORCO/VICODIN) 5-325 MG tablet Take 1 tablet by mouth every 8 (eight) hours as needed. 05/07/21   Chick Venetia BRAVO, MD  methocarbamol  (ROBAXIN ) 500 MG tablet Take 1 tablet (500 mg total) by mouth 2 (two) times daily. 05/28/20   Layden, Lindsey A, PA-C  naproxen  (NAPROSYN ) 500 MG tablet Take 1 tablet (500 mg total) by mouth 2 (two) times daily. 11/01/19   Horton, Charmaine FALCON, MD  nitrofurantoin , macrocrystal-monohydrate, (MACROBID ) 100 MG capsule Take 1 capsule (100 mg total) by mouth 2 (two) times daily. 11/10/22   Henderly, Britni A, PA-C  ondansetron  (ZOFRAN -ODT) 4 MG disintegrating tablet Take 1 tablet (4 mg total) by mouth every 8 (eight) hours as needed. 11/10/22   Henderly, Britni A, PA-C  predniSONE  (DELTASONE ) 5 MG tablet Take 6 pills for first day, 5 pills second day, 4 pills third day, 3 pills fourth day, 2 pills the fifth day, and 1 pill sixth day. 05/08/21   Chick Venetia BRAVO, MD    Allergies: Patient has no known allergies.    Review of  Systems  Updated Vital Signs BP 110/64 (BP Location: Right Arm)   Pulse (!) 50   Temp 98.2 F (36.8 C) (Oral)   Resp 16   Ht 5' 2 (1.575 m)   Wt 71.7 kg   SpO2 100%   BMI 28.90 kg/m   Physical Exam Vitals and nursing note reviewed.  Constitutional:      General: She is not in acute distress.    Appearance: She is well-developed.  HENT:     Head: Normocephalic and atraumatic.     Right Ear: External ear normal.     Left Ear: External ear normal.     Nose: Nose normal.     Mouth/Throat:     Mouth: Mucous membranes are moist.     Pharynx: Posterior oropharyngeal erythema present. No oropharyngeal exudate.  Eyes:     Extraocular Movements: Extraocular movements intact.     Conjunctiva/sclera: Conjunctivae normal.     Pupils: Pupils are equal, round, and reactive to light.  Cardiovascular:     Rate and Rhythm: Normal rate and regular rhythm.     Heart sounds: No murmur heard. Pulmonary:     Effort: Pulmonary effort is normal. No respiratory distress.     Breath sounds: Normal breath sounds.  Musculoskeletal:     Cervical back: Normal range of motion and neck supple.  Lymphadenopathy:     Cervical: No cervical adenopathy.  Skin:  General: Skin is warm and dry.  Neurological:     Mental Status: She is alert and oriented to person, place, and time. Mental status is at baseline.  Psychiatric:        Mood and Affect: Mood normal.     (all labs ordered are listed, but only abnormal results are displayed) Labs Reviewed  GROUP A STREP BY PCR  RESP PANEL BY RT-PCR (RSV, FLU A&B, COVID)  [id number]    EKG: None  Radiology: No results found.   Procedures   Medications Ordered in the ED  acetaminophen  (TYLENOL ) tablet 1,000 mg (1,000 mg Oral Given 04/06/24 1952)                                    Medical Decision Making  Amy Kemp is a 24 y.o. female who presents with URI symptoms  Initial Ddx:  URI, sinusitis, pneumonia, pharyngitis  MDM:  Feel the  patient likely has a URI based on their symptoms.  They are overall well-appearing without wheezing or rhonchi so do not feel that chest x-ray is warranted.  Feel sinusitis unlikely. Low concern for strep throat at this point but was ordered from triage and was negative.  Of note, did have a significantly elevated blood pressure that was initially taken in triage but had a very large sweatshirt on.  When I removed the sweatshirt and checked the blood pressure myself it was WNL.  Nursing did recheck her blood pressure after this and it was also normal so feel that the first value was spurious  Plan:  COVID/flu  ED Summary/Re-evaluation:  Patient reevaluated in the emergency department and was stable. Covid and flu swab negative but suspect that she has URI symptoms rhinovirus. Will have them call their PCP in several days to ensure they are recovering as expected.   This patient presents to the ED for concern of complaints listed in HPI, this involves an extensive number of treatment options, and is a complaint that carries with it a high risk of complications and morbidity. Disposition including potential need for admission considered.   Dispo: DC Home. Return precautions discussed including, but not limited to, those listed in the AVS. Allowed pt time to ask questions which were answered fully prior to dc.  Records reviewed Outpatient Clinic Notes  Portions of this note were generated with Dragon dictation software. Dictation errors may occur despite best attempts at proofreading.      Final diagnoses:  Upper respiratory tract infection, unspecified type    ED Discharge Orders     None          Yolande Lamar BROCKS, MD 04/07/24 1409    Yolande Lamar BROCKS, MD 04/07/24 1410  "

## 2024-04-06 NOTE — ED Triage Notes (Signed)
 Pt reports sore throat and flu like S/S since last PM.

## 2024-04-06 NOTE — Discharge Instructions (Signed)
 You were seen for your upper respiratory tract infection in the emergency department.   At home, please use Tylenol  and ibuprofen  for your muscle aches and fevers.  Please use over-the-counter cough medication or tea with honey for your cough.  You may also use NyQuil to help at night  Follow-up with your primary doctor in 2-3 days regarding your visit.  This may be over the phone.  Return immediately to the emergency department if you experience any of the following: Difficulty breathing, or any other concerning symptoms.    Thank you for visiting our Emergency Department. It was a pleasure taking care of you today.
